# Patient Record
Sex: Male | Born: 1965 | Race: Black or African American | Hispanic: No | Smoking: Current some day smoker
Health system: Southern US, Community
[De-identification: ages and names within clinical notes are randomized; demographics above are authoritative.]

## PROBLEM LIST (undated history)

## (undated) DIAGNOSIS — M1990 Unspecified osteoarthritis, unspecified site: Secondary | ICD-10-CM

## (undated) DIAGNOSIS — M109 Gout, unspecified: Secondary | ICD-10-CM

## (undated) DIAGNOSIS — I10 Essential (primary) hypertension: Secondary | ICD-10-CM

## (undated) DIAGNOSIS — N289 Disorder of kidney and ureter, unspecified: Secondary | ICD-10-CM

## (undated) HISTORY — PX: KNEE ARTHROSCOPY: SUR90

---

## 2000-03-12 ENCOUNTER — Inpatient Hospital Stay (HOSPITAL_COMMUNITY): Admission: EM | Admit: 2000-03-12 | Discharge: 2000-03-15 | Payer: Self-pay | Admitting: Cardiology

## 2001-05-26 ENCOUNTER — Emergency Department (HOSPITAL_COMMUNITY): Admission: EM | Admit: 2001-05-26 | Discharge: 2001-05-26 | Payer: Self-pay | Admitting: Emergency Medicine

## 2001-07-31 ENCOUNTER — Inpatient Hospital Stay (HOSPITAL_COMMUNITY): Admission: EM | Admit: 2001-07-31 | Discharge: 2001-08-05 | Payer: Self-pay | Admitting: *Deleted

## 2001-07-31 ENCOUNTER — Encounter: Payer: Self-pay | Admitting: *Deleted

## 2001-08-09 ENCOUNTER — Encounter: Admission: RE | Admit: 2001-08-09 | Discharge: 2001-11-07 | Payer: Self-pay | Admitting: Internal Medicine

## 2001-08-12 ENCOUNTER — Emergency Department (HOSPITAL_COMMUNITY): Admission: EM | Admit: 2001-08-12 | Discharge: 2001-08-13 | Payer: Self-pay | Admitting: *Deleted

## 2001-08-13 ENCOUNTER — Encounter: Payer: Self-pay | Admitting: *Deleted

## 2001-09-03 ENCOUNTER — Emergency Department (HOSPITAL_COMMUNITY): Admission: EM | Admit: 2001-09-03 | Discharge: 2001-09-03 | Payer: Self-pay | Admitting: *Deleted

## 2001-09-06 ENCOUNTER — Emergency Department (HOSPITAL_COMMUNITY): Admission: EM | Admit: 2001-09-06 | Discharge: 2001-09-06 | Payer: Self-pay | Admitting: *Deleted

## 2001-10-20 ENCOUNTER — Emergency Department (HOSPITAL_COMMUNITY): Admission: EM | Admit: 2001-10-20 | Discharge: 2001-10-20 | Payer: Self-pay | Admitting: Emergency Medicine

## 2001-12-09 ENCOUNTER — Emergency Department (HOSPITAL_COMMUNITY): Admission: EM | Admit: 2001-12-09 | Discharge: 2001-12-09 | Payer: Self-pay | Admitting: Internal Medicine

## 2002-04-16 ENCOUNTER — Emergency Department (HOSPITAL_COMMUNITY): Admission: EM | Admit: 2002-04-16 | Discharge: 2002-04-16 | Payer: Self-pay | Admitting: Internal Medicine

## 2002-10-13 ENCOUNTER — Emergency Department (HOSPITAL_COMMUNITY): Admission: EM | Admit: 2002-10-13 | Discharge: 2002-10-13 | Payer: Self-pay | Admitting: Emergency Medicine

## 2002-11-19 ENCOUNTER — Emergency Department (HOSPITAL_COMMUNITY): Admission: EM | Admit: 2002-11-19 | Discharge: 2002-11-19 | Payer: Self-pay | Admitting: Internal Medicine

## 2002-11-19 ENCOUNTER — Encounter: Payer: Self-pay | Admitting: Internal Medicine

## 2002-11-22 ENCOUNTER — Inpatient Hospital Stay (HOSPITAL_COMMUNITY): Admission: EM | Admit: 2002-11-22 | Discharge: 2002-11-24 | Payer: Self-pay | Admitting: Cardiology

## 2002-11-22 ENCOUNTER — Encounter: Payer: Self-pay | Admitting: Emergency Medicine

## 2002-12-11 ENCOUNTER — Encounter: Payer: Self-pay | Admitting: Emergency Medicine

## 2002-12-11 ENCOUNTER — Emergency Department (HOSPITAL_COMMUNITY): Admission: EM | Admit: 2002-12-11 | Discharge: 2002-12-11 | Payer: Self-pay | Admitting: Emergency Medicine

## 2002-12-13 ENCOUNTER — Ambulatory Visit (HOSPITAL_COMMUNITY): Admission: RE | Admit: 2002-12-13 | Discharge: 2002-12-14 | Payer: Self-pay | Admitting: Internal Medicine

## 2004-01-10 ENCOUNTER — Inpatient Hospital Stay (HOSPITAL_COMMUNITY): Admission: EM | Admit: 2004-01-10 | Discharge: 2004-01-12 | Payer: Self-pay | Admitting: Emergency Medicine

## 2004-02-19 ENCOUNTER — Emergency Department (HOSPITAL_COMMUNITY): Admission: EM | Admit: 2004-02-19 | Discharge: 2004-02-19 | Payer: Self-pay | Admitting: Emergency Medicine

## 2004-07-29 ENCOUNTER — Observation Stay (HOSPITAL_COMMUNITY): Admission: EM | Admit: 2004-07-29 | Discharge: 2004-07-31 | Payer: Self-pay | Admitting: Emergency Medicine

## 2007-06-29 ENCOUNTER — Emergency Department (HOSPITAL_COMMUNITY): Admission: EM | Admit: 2007-06-29 | Discharge: 2007-06-29 | Payer: Self-pay | Admitting: Emergency Medicine

## 2007-12-16 ENCOUNTER — Emergency Department (HOSPITAL_COMMUNITY): Admission: EM | Admit: 2007-12-16 | Discharge: 2007-12-16 | Payer: Self-pay | Admitting: Emergency Medicine

## 2007-12-26 ENCOUNTER — Emergency Department (HOSPITAL_COMMUNITY): Admission: EM | Admit: 2007-12-26 | Discharge: 2007-12-26 | Payer: Self-pay | Admitting: Emergency Medicine

## 2008-05-18 ENCOUNTER — Emergency Department (HOSPITAL_COMMUNITY): Admission: EM | Admit: 2008-05-18 | Discharge: 2008-05-18 | Payer: Self-pay | Admitting: Emergency Medicine

## 2008-07-10 ENCOUNTER — Emergency Department (HOSPITAL_COMMUNITY): Admission: EM | Admit: 2008-07-10 | Discharge: 2008-07-10 | Payer: Self-pay | Admitting: Emergency Medicine

## 2009-05-02 ENCOUNTER — Emergency Department (HOSPITAL_COMMUNITY): Admission: EM | Admit: 2009-05-02 | Discharge: 2009-05-02 | Payer: Self-pay | Admitting: Emergency Medicine

## 2009-05-24 ENCOUNTER — Emergency Department (HOSPITAL_COMMUNITY): Admission: EM | Admit: 2009-05-24 | Discharge: 2009-05-24 | Payer: Self-pay | Admitting: Emergency Medicine

## 2011-03-17 LAB — SYNOVIAL CELL COUNT + DIFF, W/ CRYSTALS
Eosinophils-Synovial: 0 % (ref 0–1)
Lymphocytes-Synovial Fld: 3 % (ref 0–20)
Neutrophil, Synovial: 91 % — ABNORMAL HIGH (ref 0–25)

## 2011-03-17 LAB — BODY FLUID CULTURE: Culture: NO GROWTH

## 2011-03-17 LAB — AFB CULTURE WITH SMEAR (NOT AT ARMC): Acid Fast Smear: NONE SEEN

## 2011-03-17 LAB — LYME DISEASE DNA BY PCR(BORRELIA BURG): Lyme Disease(B.burgdorferi)PCR: NOT DETECTED

## 2011-03-17 LAB — GLUCOSE, SYNOVIAL FLUID: Glucose, Synovial Fluid: 73 mg/dL

## 2011-03-17 LAB — PROTEIN, BODY FLUID: Total protein, fluid: 4.6 g/dL

## 2011-04-22 ENCOUNTER — Emergency Department (HOSPITAL_COMMUNITY)
Admission: EM | Admit: 2011-04-22 | Discharge: 2011-04-22 | Discharge status: Against Medical Advice | Payer: Non-veteran care | Attending: Emergency Medicine | Admitting: Emergency Medicine

## 2011-04-22 DIAGNOSIS — Z0389 Encounter for observation for other suspected diseases and conditions ruled out: Secondary | ICD-10-CM | POA: Insufficient documentation
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
## 2011-04-24 NOTE — Group Therapy Note (Signed)
[facility]  Patient:    Kristopher Hogan, Kristopher Hogan Visit Number: [id number] MRN: [id number]          Service Type: Attending:  Kari Baars, M.D. Dictated by:   Kari Baars, M.D.                     Progress Note/EKG Interpretations  1745 on September 06, 2001  INTERPRETATION: The rhythm is a supraventricular tachycardia with a very fast rate at about 220.  There are ST-T wave abnormalities which could be related to ischemia.  IMPRESSION: Abnormal electrocardiogram. Dictated by:   Kari Baars, M.D. Attending:  Kari Baars, M.D. DD:  09/07/01 TD:  09/08/01 Job: [id number]

## 2011-04-24 NOTE — Discharge Summary (Signed)
NAME:  Kristopher Hogan, Kristopher Hogan                        ACCOUNT NO.:  [id number]   MEDICAL RECORD NO.:  [id number]                   PATIENT TYPE:  INP   LOCATION:  A226                                 FACILITY:  [facility]   PHYSICIAN:  Tesfaye D. Felecia Shelling, M.D.              DATE OF BIRTH:  Jul 16, 1966   DATE OF ADMISSION:  01/09/2004  DATE OF DISCHARGE:  01/12/2004                                 DISCHARGE SUMMARY   DISCHARGE DIAGNOSES:  1. Acute gastroenteritis.  2. Hypertension secondary to above.  3. Renal insufficiency secondary to dehydration.  4. Diabetes mellitus.  5. History of __________ tachycardia and status post ablation therapy.  6. Hypertension.   DISCHARGE MEDICATIONS:  1. Humulin insulin 70/30, 60 units subcu q.a.m. and 40 units subcu q.p.m.  2. Spironolactone 25 mg 2 tablets p.o. q.a.m.  3. Glipizide 5 mg p.o. b.i.d.  4. Cardizem 360 mg p.o. daily.  5. Lisinopril 40 mg p.o. daily.   DISPOSITION:  The patient was discharged home in a stable condition.   HOSPITAL COURSE:  This is a 45 year old male patient with a history of  nausea, vomiting and abdominal pain.  He was admitted on January 09, 2004.  The patient was found to have severe dehydration.  His BUN and creatinine  were elevated.  The patient was given IV fluid and his renal function was  closely monitored and a nephrology consult was done, who assisted in his  treatment.  Over the hospital stay the patient gradually improved.  His  symptoms subsided.  His renal function completely resolved and the patient  came back to his baseline.  He was discharged back home in stable condition.     ___________________________________________                                         [name] Felecia Shelling, M.D.   TDF/MEDQ  D:  02/05/2004  T:  02/05/2004  Job:  [id number]

## 2011-04-24 NOTE — Op Note (Signed)
NAME:  Kristopher Hogan, Kristopher Hogan                        ACCOUNT NO.:  [id number]   MEDICAL RECORD NO.:  [id number]                   PATIENT TYPE:  OIB   LOCATION:  2001                                 FACILITY:  [facility]   PHYSICIAN:  Duke Salvia, M.D. [facility]           DATE OF BIRTH:  1966-07-16   DATE OF PROCEDURE:  12/13/2002  DATE OF DISCHARGE:                                 OPERATIVE REPORT   NOTE:  Optical disk number is [id number].   PREPROCEDURE DIAGNOSIS:  Recurrent atrioventricular re-entry tachycardia  with previous electrophysiologic study at which time mapping results had  bumping and nonconduction of his pathway.   POSTPROCEDURE DIAGNOSES:  1. Concealed anteroseptal accessory pathway.  2. Transient complete heart block.   PROCEDURE PERFORMED:  1. Invasive electrophysiological study.  2. Radiofrequency catheter ablation.  3. __________ retinal infusion.   CARDIOLOGIST:  Duke Salvia, M.D.   DESCRIPTION OF PROCEDURE:  Following the obtainment of informed consent the  patient was brought to the electrophysiology laboratory and placed on the  fluoroscopic table in the supine position.  After routine prep and drape  cardiac catheterization was performed under local anesthesia and conscious  sedation.  Noninvasive blood pressure monitoring, transcutaneous oxygen  saturation monitoring and end tidal CO2 monitoring were performed  continuously throughout the procedure.   Following the procedure the catheters were removed, hemostasis was obtained  and the patient was transferred to the floor in stable condition.   Catheters:  A 5 French quadripolar catheter.  It was inserted via the left  femoral vein to the AV junction.  A 5 French quadripolar catheter  was  inserted via the left femoral vein to the right ventricular apex.  A 7  French duodecapolar was inserted via the left femoral vein to the tricuspid  annulus and then to the coronary sinus.  A 7 French 4 mm deflectable-tip  catheter was inserted via Riley Kill (SRO) sheath to mapping sites in the  midseptal and anteroseptal space.   Surface leads 1, aVF and V1 were monitored continuous throughout the  procedure.   Following insertion of the catheters stimulation protocol included  incremental atrial pacing, incremental ventricular pacing, single atrial  extrastimuli in the absence and the presence of verapamil and/or  isoproterenol at 600, 500 and 400 msec.  Single and double ventricular  extrastimuli in the same context as above.   RESULTS:  Surface Electrocardiogram:  Rhythm:  Initial and final are sinus.  Cycle length is 811 initial msec and 692 msec final.  P-R interval is 194 initial msec and 192 msec final.  QRS duration is 95 msec initial and 96 msec final.  Q-T interval is 397 msec initial and 397 msec final.  P-wave duration is 124 msec initial and 131 msec final.  Bundle branch block is absent and absent.  Pre-excitation is absent and absent.   A-V Nodal Function:  A-H interval is 97 msec initial and  final is 118 msec.  A-V Wenckebach was 280 msec preablation and 280 msec postablation.  V-A Wenckebach was approximately 320 msec preablation and 400 msec  postablation.  A-V conduction was continuous pre- and postablation.   His Purkinje System Function:  H-V interval is 43 msec initial and 52 msec final.   Accessory Pathway Function:  An accessory pathway was identified in the  right mid to anteroseptal space.  At his previous ablation the catheter  identified the earliest his atrial activation was anterior to and somewhat  medial to in the LAO the His bundle.  Today the earliest atrial activation  was mapped just inferior to the His bundle.  The His bundle electrogram  actually being identified over a total space of approximately 10 mm.  There  was a minimal His electrogram on the ablation catheter at the site of  ablation (see below).   Arrhythmia Induced:  SVT was finally induced after  about two hours of  attempts at induction.  Ultimately it was reproducible inducible at 600:320  and I suspect that during catheter placement I had bumped the pathway again.  The cycle length of the tachycardia was 210 msec and earliest atrial  activation was as noted on the anteroseptal space.   Two applications of RF energy were applied; one of 61 seconds duration and  one of 17 seconds duration (see below), the first of which terminated  tachycardia, but it terminated tachycardia after V-A interval, which made me  suspect that conduction through the pathway had not been eliminated; and,  indeed tachycardia was subsequently reinducible.  The last application of RF  energy resulted in termination of tachycardia initially with A-H  prolongation and then no antegrade conduction, that is A-A not conducting to  V.  There was antegrade conduction on the following beat.  There was then no  antegrade conduction across the A-V node on two subsequent beats and RF  energy was discontinued.  A total of five seconds of RF energy was applied  at maximum temperature output as the temperature had been incrementally  increased from 40 degrees to 60 degrees with neutral problems with A-V nodal  conduction.  However, following the application of RF energy two things were  noted:  One was that the A-H interval was somewhat longer and the A-V  Wenckebach cycle length was considerably longer.  In addition V-A conduction  over the pathway was not seen as there was Wenckebach distally in the  retrograde direction at cycle lengths of 400-460 msec.   Total RF energy was 68 seconds.  Total fluoroscopy time was 15 minutes.   IMPRESSION:  1. Normal sinus function.  2. Normal atrial function.  3. Normal A-V nodal function (see below).  4. Normal His Purkinje system function.  5. An accessory pathway was identified in the anteroseptal space.  I suspect    that this was a recurrence of the previous tachycardia,  although it     seemed to be a little bit more inferior than previously described.   Transient complete heart block resulted obviously in elimination of the  tachycardia, but furthermore there was no further inducibility of  tachycardia or evidence of retrograde conduction at a cycle length  consistent with V-A conduction over a pathway.  Partly this may have  resulted from just transient damage given the short duration of RF, but  after a wait of 45 minutes still no significant V-A conduction was  identified.  At this point the  procedure was terminated with only modest  optimism that conduction over the accessory pathway was permanently  eliminated.                                                  Duke Salvia, M.D. [facility]    SCK/MEDQ  D:  12/13/2002  T:  12/13/2002  Job:  [id number]

## 2011-04-24 NOTE — Consult Note (Signed)
NAME:  Kristopher Hogan, Kristopher Hogan                        ACCOUNT NO.:  [id number]   MEDICAL RECORD NO.:  [id number]                   PATIENT TYPE:  INP   LOCATION:  A226                                 FACILITY:  [facility]   PHYSICIAN:  Jorja Loa, M.D.             DATE OF BIRTH:  [birth year]   DATE OF CONSULTATION:  DATE OF DISCHARGE:                                   CONSULTATION   REASON FOR CONSULTATION:  Renal insufficiency.   HISTORY:  Elwin is a 45 year old African American who has a past medical  history of type 2 diabetes, uncontrolled hypertension, history of SVT who  presently came to the emergency room with a three-day history of nausea,  vomiting, severe, watery diarrhea with hypotension.  Hence, the patient was  admitted for gastroenteritis.  As a work up on his admission, the patient  was found to have an elevated BUN and creatinine.  Hence, consultation is  called.  The patient at this moment denies any previous history of renal  insufficiency or history of kidney stone.  No edema, no diabetic  retinopathy, and also no diabetic neuropathy.  Overall, he claims to be  feeling good until this time.   This morning, he denies any fever, chills, or sweating.  The nausea and  vomiting seems to be getting better.   PAST MEDICAL HISTORY:  As listed above.  A patient has a longstanding  history of hypertension, uncontrolled, and type 2 diabetes at least four or  five years.  A history of SVT, status post a catheter ablation x2, and  history of right knee surgery.   SOCIAL HISTORY:  He has according to him, a previous history of tobacco  abuse, but no smoking at this moment.  History of also alcohol abuse.  He  denies any drug use.   ALLERGIES:  There is a question of a history of allergy to penicillin.   MEDICATIONS:  1. Novolin and NovoLog 10 subcu q.h.s.  _________ mg IV at 150 cc an hour.  2. Tylenol on a p.r.n. basis.  3. Lomotil one tablet on p.r.n. basis.  4. _________  p.r.n. basis.   The patient states that he has been on blood pressure medication including  lisinopril, Diltiazem and  spironolactone.   REVIEW OF SYSTEMS:  He feels better this morning.  No nausea or vomiting.  No chest pain.  No shortness of breath is noted.  Appetite is reasonable.  No diarrhea.   PHYSICAL EXAMINATION:  VITAL SIGNS:  Temperature 97.9, pulse 85, blood  pressure 88/49 which seems to be improving from 73/41.  HEENT:  No conjunctival pallor, nonicteric.  Oral mucosa seems to be moist.  NECK:  Supple, no JVD.  CHEST:  Clear to auscultation, no rales, no rhonchi.  HEART:  Exam revealed regular rate and rhythm, no murmur, no S2 or S3.  ABDOMEN:  Soft, positive bowel sounds.  EXTREMITIES:  No edema.  LABORATORY DATA:  He has 1300 input and 160 so far output.  His blood work,  white cell count is 9.9, hemoglobin 14, hematocrit 40.8, platelets 301.  His  sodium is 132.  Yesterday it was 130.  Potassium 4.9.  CO2 18, BUN 59,  creatinine 6.3.  When he came, his BUN was 64 and creatinine 8.5 and calcium  is 8.6.  His urine specific gravity is 1.03.  He has small bilirubin, large  ketone, protein, and he has some bacteria and nitrites and leukocytes is  negative.   ASSESSMENT:  1. Acute renal failure.  At this moment, seems to be from possible     dehydration of his urine.  Specific  gravity is very high.  BUN and     creatinine ratio also seems to be high with hypotension.  Since the     patient does not have any history of diabetic retinopathy, no neuropathy,     no edema, at this moment, not sure whether the patient has underlying     hypertensive or diabetic nephropathy.  2. Small proteinuria.  This is a concentrated urine.  Hence, probably may     not be significant.  3. Hypotension from intravascular volume associated with dehydration and     also because of diarrhea.  4. Diabetes.  He is on insulin.  5. History of hypotension .  Blood pressure low.  He is not on  any     medication at this moment.  6. History of supraventricular tachycardia, status post ablation.   RECOMMENDATION:  1. I agree with hydration.  2. We will do ultrasound of the kidneys to evaluate his kidneys.  At this     moment, we will check also his UA in the morning.  We will do an A     complement, hepatitis A and hepatitis B to rule out other causes of renal     insufficiency and some proteinuria.  We will follow the patient.      ___________________________________________                                            Jorja Loa, M.D.   BB/MEDQ  D:  01/10/2004  T:  01/10/2004  Job:  [id number]

## 2011-04-24 NOTE — H&P (Signed)
NAME:  Kristopher Hogan, Kristopher Hogan                        ACCOUNT NO.:  [id number]   MEDICAL RECORD NO.:  [id number]                   PATIENT TYPE:  OBV   LOCATION:  A226                                 FACILITY:  [facility]   PHYSICIAN:  Kingsley Callander. Ouida Sills, M.D.                  DATE OF BIRTH:  15-Oct-1966   DATE OF ADMISSION:  01/09/2004  DATE OF DISCHARGE:                                HISTORY & PHYSICAL   CHIEF COMPLAINT:  Vomiting and diarrhea.   HISTORY OF PRESENT ILLNESS:  This patient is a 45 year old African American  male with a history of diabetes and kidney failure who presented to the  emergency room with a three day history of a gastrointestinal illness.  He  last vomited on Monday.  He has had nausea persisting.  He was able to eat a  hamburger earlier today.  He has had persistent diarrhea, though, for three  days.  He has had approximately eight watery stools today.  He denies any  rectal bleeding, melena, or hematemesis.  He has not experienced abdominal  pain other than some mild cramping earlier.  He was evaluated and treated in  the emergency room for hypotension.  His initial blood pressure was 82/43.  Despite IV fluids, his blood pressure standing was 83/49 with a pulse of  101.  His BUN and creatinine were also quite elevated at 64 and 8.5.  His  baseline is not known.  He does have a history of renal insufficiency and  takes Lisinopril, spironolactone, and Diltiazem.  He has continued to take  these medications throughout his GI illness.   PAST MEDICAL HISTORY:  1. Hypertension.  2. Diabetes.  3. Kidney failure.  4. Right knee surgery.  5. Two ablations by Dr. Graciela Husbands.   MEDICATIONS:  1. Humulin 70/30, 60 units q.a.m., 40 units q.p.m.  2. Spironolactone 25 mg two q.a.m.  3. Glipizide 5 mg two b.i.d.  4. Diltiazem 360 mg every day.  5. Lisinopril 40 mg every day.   ALLERGIES:  PENICILLIN.   SOCIAL HISTORY:  He does not smoke cigarettes.  He drinks alcohol  occasionally.   He is disabled from the service and routinely has his medical  care through the [facility].   FAMILY HISTORY:  His mother had hypertension and diabetes.   REVIEW OF SYSTEMS:  He denies chest pain, abdominal pain, or syncope.  He  states he is still making urine quite well.   PHYSICAL EXAMINATION:  VITAL SIGNS:  Temperature 97.5, blood pressure 79/47  lying, pulse 92, respirations 20.  GENERAL:  Alert and oriented in no distress.  HEENT:  The oropharynx appears moist.  Eyes were unremarkable.  NECK:  Supple without JVD or thyromegaly.  LUNGS:  Clear.  HEART:  Regular with no murmurs.  ABDOMEN:  Nontender with no hepatosplenomegaly.  EXTREMITIES:  No cyanosis, clubbing or edema.  NEUROLOGIC:  Grossly intact.  LABORATORY DATA:  White count of 9.9, hemoglobin 14, platelets 301.  Sodium  130, potassium 4.6, bicarb 22, glucose 119, BUN 64, creatinine 8.5.   IMPRESSION:  1. Hypotension, renal failure, dehydration.  He is being hospitalized for     additional intravenous fluids.  We will reassess his BUN and creatinine     tomorrow morning.  His Lisinopril, spironolactone and Diltiazem will be     held.  His diarrhea will be worked up with stool studies.  2. Diabetes.  We will hold his Glipizide and insulin and follow morning and     evening Accu-Chek and treated with sliding scale Humalog.  3. Status post ablations.     ___________________________________________                                         Kingsley Callander. Ouida Sills, M.D.   ROF/MEDQ  D:  01/09/2004  T:  01/09/2004  Job:  [id number]   cc:   Tesfaye D. Felecia Shelling, M.D.  [address]  [place]  [place] [address]  Fax: [phone]

## 2011-04-24 NOTE — H&P (Signed)
NAME:  Kristopher Hogan, Kristopher Hogan                        ACCOUNT NO.:  [id number]   MEDICAL RECORD NO.:  [id number]                   PATIENT TYPE:  INP   LOCATION:  2029                                 FACILITY:  [facility]   PHYSICIAN:  Doylene Canning. Ladona Ridgel, M.D. [facility]           DATE OF BIRTH:  1966-09-13   DATE OF ADMISSION:  11/22/2002  DATE OF DISCHARGE:                                HISTORY & PHYSICAL   ADMISSION DIAGNOSIS:  Recurrent incessant supraventricular tachycardia  associated with hemodynamic instability.   HISTORY OF PRESENT ILLNESS:  The patient is a very pleasant 45 year old man  with a history of recurrent tachy palpitations for many years.  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  At  that time, he underwent successful catheter ablation but developed recurrent  tachy palpitations beginning about two years after his procedure. Since  then, he has had recurrent episodes of SVT.  He was in his usual state of  health until this morning when he awoke with SVT and was taken to the  emergency room at [facility]. At that time he was given successive  doses of adenosine without termination of his tachycardia.  He was  transferred here for additional evaluation.   At the time of his transfer, his heart rate was 240 beats per minute with a  blood pressure of 100.  The patient felt anxious, clammy, and dyspneic with  his SVT.  He was subsequently treated at the bedside with intravenous  verapamil delivered under my direct guidance with a total of 7 mg delivered  followed by carotid sinus massage with resultant termination of his SVT.   PAST MEDICAL HISTORY:  As previously noted.  In addition, he has a history  of hypertension and tobacco abuse.  There is a history of diabetes.   SOCIAL HISTORY:  The patient is single, disabled.  Denies tobacco or ethanol  use.   FAMILY HISTORY:  Notable for both parents being alive and otherwise in good  health.  Review of Systems notable for no fevers, chills, night sweats.  He  does have palpitations.  He denies polyuria or polydipsia.  The rest of his  Review of Systems is negative and as noted by Chinita Pester in her admission  note.   PHYSICAL EXAMINATION:  GENERAL:  He is a pleasant, well-appearing man in no  distress.  Prior to termination of SVT, he was diaphoretic, pale, clammy,  and very anxious.  VITAL SIGNS:  Blood pressure presently is 130/94, pulse 72 and regular,  respirations 18.  HEENT:  Normocephalic and atraumatic.  Pupils are equal and round.  Oropharynx was moist.  The sclerae were anicteric.  NECK:  No jugular venous distention.  Carotids 2+ and symmetric.  Trachea  was midline.  There was no thyromegaly.  CARDIOVASCULAR:  Regular  rate and rhythm with normal S1 and S2. There were  no gallops.  LUNGS: Clear bilaterally to auscultation.  ABDOMEN:  Soft, nontender, nondistended.  There was no organomegaly.  EXTREMITIES:  NO cyanosis, clubbing, or edema.  NEUROLOGIC:  Alert and oriented x 3 with cranial nerves II-XII grossly  intact.  Strength 5/5 and symmetric.   LABORATORY DATA:  EKG demonstrates normal sinus rhythm with normal axis and  intervals.  EKG in SVT demonstrates narrow QRS tachycardia with QRS  alternans and a rate of 220 to 240 beats per minute.   IMPRESSION:  1. 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  2. Hypertension.  3. Diabetes.   DISCUSSION:  I have discussed the treatment options with the patient.  He is  willing to proceed with UPS and catheter ablation of his SVT.  This will be  attempted to be scheduled for tomorrow morning, schedule permitting.  If  not, he will be discharged home and allowed to return as an outpatient for  ablation therapy.                                                Doylene Canning. Ladona Ridgel, M.D. [facility]    GWT/MEDQ  D:  11/22/2002  T:  11/22/2002  Job:  [id number]   cc:   Tesfaye D. Felecia Shelling, M.D.  [address]  [place]  [place] [address]  Fax: [phone]   [facility], Deberah Pelton, M.D. [facility]

## 2011-04-24 NOTE — Discharge Summary (Signed)
NAME:  Kristopher Hogan, Kristopher Hogan                        ACCOUNT NO.:  [id number]   MEDICAL RECORD NO.:  [id number]                   PATIENT TYPE:  INP   LOCATION:  A302                                 FACILITY:  [facility]   PHYSICIAN:  Tesfaye D. Felecia Hogan, M.D.              DATE OF BIRTH:  Apr 26, 1966   DATE OF ADMISSION:  07/29/2004  DATE OF DISCHARGE:  07/31/2004                                 DISCHARGE SUMMARY   DISCHARGE DIAGNOSES:  1. Laceration of the right forearm.  2. Anemia secondary to blood loss.  3. Diabetes mellitus.  4. __________  5. History of tachyarrhythmia, status post ablation therapy.   DISCHARGE MEDICATIONS:  1. Keflex 500 mg one tablet p.o. q.i.d. for seven days.  2. Lortab 5/500 mg one tablet p.o. q.6h. for pain.  3. Humulin insulin 70/30 60 units subcutaneously in the a.m. and 40 units     subcutaneously in p.m.  4. Cardizem CD 360 mg p.o. daily.  5. Lisinopril 40 mg p.o. daily.  6. Glucotrol 5 mg p.o. b.i.d.  7. Spironolactone 25 mg two tablets p.o. daily.   DISPOSITION:  The patient will be discharged home in stable condition.   HISTORY OF PRESENT ILLNESS:  This is a 45 year old male patient with a  history of __________ diabetes mellitus.  He sustained a laceration on his  right forearm.  The patient was brought to the emergency room where he was  evaluated and his wounds were sutured.  He had a large laceration which had  some arterial bleed.  The patient lost a significant amount of blood and he  became orthostatic and dizzy.  The patient was started on IV fluids and was  admitted.   HOSPITAL COURSE:  The patient was monitored and his hemoglobin and  hematocrit were repeated.  There was a significant drop in his hemoglobin  and hematocrit.  The patient was transfused two units of packed red blood  cells.  His hemoglobin and hematocrit improved.  The patient has no further  bleeding.  He was evaluated by Dr. Lovell Sheehan and advised to continue on  outpatient  physical therapy.  The patient will be discharged back home on  oral antibiotics and pain medication.     ___________________________________________                                         Kristopher Hogan, M.D.   TDF/MEDQ  D:  07/31/2004  T:  07/31/2004  Job:  [id number]

## 2011-04-24 NOTE — Procedures (Signed)
   NAME:  Kristopher Hogan, Kristopher Hogan                        ACCOUNT NO.:  [id number]   MEDICAL RECORD NO.:  [id number]                   PATIENT TYPE:  OIB   LOCATION:  NA                                   FACILITY:  [facility]   PHYSICIAN:  Edward L. Juanetta Gosling, M.D.             DATE OF BIRTH:  1966-08-28   DATE OF PROCEDURE:  11/19/2002  DATE OF DISCHARGE:                                EKG INTERPRETATION   The rhythm appears to be atrial fibrillation with a rapid ventricular  response at about 200.  There are fairly marked ST-T wave changes diffusely  which could indicate ischemia.  Abnormal electrocardiogram.                                               Oneal Deputy. Juanetta Gosling, M.D.    ELH/MEDQ  D:  12/12/2002  T:  12/13/2002  Job:  [id number]

## 2011-04-24 NOTE — Discharge Summary (Signed)
[address] [facility]  Patient:    Kristopher Hogan, Kristopher Hogan                     MRN: [id number] Adm. Date:  [id number] Disc. Date: 03/15/00 Attending:  Nelta Numbers Dictator:   Abelino Derrick, P.A.-C. [facility] CC:         Gerrit Friends. Dietrich Pates, M.D. [facility], [place]                           Discharge Summary  DISCHARGE DIAGNOSES: 1. Recurrent premature supraventricular ventricular tachycardia, evaluated    by Dr. Doylene Canning. Ladona Ridgel this admission. 2. History of ETOH abuse. 3. Hypertension. 4. PENICILLIN allergy.  HISTORY OF PRESENT ILLNESS:  The patient is a 45 year old male with a history of PSVT.  He had radiofrequency ablation two years ago by Dr. Nathen May. He was last seen a couple of months ago in the office.  He was admitted on March 11, 2000, to [place] with a rapid heart rate.  He was treated with adenosine, Lanoxin, Cardizem, and eventually cardioverted to sinus rhythm.  He has a history of daily alcohol use.  His ETOH level use was 225 on admission.  He apparently as not been taking his medicines regularly.  He maintained sinus rhythm.  HOSPITAL COURSE:  He was transferred from [facility] to [facility] for an EP evaluation.  It was noted at [facility] that his TSH was 0.42 with a T3 of 31, and this was repeated.  His TSH here was 0.616, with a normal free T4.  The patient was admitted to telemetry.  He was started n antibiotics for bronchitis.  He was started on Cardizem.  DISPOSITION:  Dr. Ladona Ridgel saw him on March 15, 2000, and feels that he can be discharged on Cardizem.  We have also added flecainide at discharge.  He received 50 mg prior to going home, and then is to be put on 100 mg b.i.d.  FOLLOWUP:  He will come to the office in 48 hours for an electrocardiogram.  He  will follow up with the P.A[facility] in [place] later this week.  Dr. Bruna Potter EP note revealed that the patient had a  concealed anteroseptal accessory pathway, and __________ SVT.  He felt that a redo catheter ablation carried a risk for this patient for a heart block.  He recommended medical therapy, as noted above.   He also says he would not perform an exercise treadmill on this patient, because of his inactivity and severe limitations because of knee surgery.  DISCHARGE MEDICATIONS: 1. Cardizem CD 240 mg q.d. 2. Flecainide 100 mg b.i.d. 3. Aspirin q.d. 4. Zithromax 250 mg q.d. for three days.  LABORATORY DATA:  At [facility] the CPK and troponin were negative. BUN was 10, creatinine 1.0, sodium 140, potassium 3.7.  SGPT 200.  TSH 0.42 as noted. Drug screen was negative.  Alcohol level was 225. White count 10, hemoglobin 16.9, hematocrit 50, platelet count 269.  INR 1.02.  The laboratory revealed a free T4 of 1.11 and a repeat TSH of 0.616.  Chest x-ray revealed mild cardiac prominence.  CONDITION ON DISCHARGE:  The patient is discharged in sinus rhythm.  His QTC is 445 at discharge. DD:  03/15/00 TD:  03/15/00 Job: [id number]

## 2011-04-24 NOTE — Discharge Summary (Signed)
   NAME:  Kristopher Hogan, Kristopher Hogan                        ACCOUNT NO.:  [id number]   MEDICAL RECORD NO.:  [id number]                   PATIENT TYPE:  OIB   LOCATION:  2001                                 FACILITY:  [facility]   PHYSICIAN:  Duke Salvia, M.D. [facility]           DATE OF BIRTH:  03-09-1966   DATE OF ADMISSION:  12/13/2002  DATE OF DISCHARGE:  12/14/2002                                 DISCHARGE SUMMARY   PRIMARY DIAGNOSIS:  Supraventricular tachycardia.   SECONDARY DIAGNOSES:  1. Hypertension.  2. Tobacco abuse.  3. Diabetes.   HISTORY OF PRESENT ILLNESS:  This is a 45 year old gentleman with past  medical history of SVT who was recently admitted to [facility] on November 22, 2002, for SVT.  He had an EP study on November 23, 2002, showing retrograde conduction, mid septal posterior septal area which  was bumped.  The patient was then unable to be induced.  He was discharged  to home, to be readmitted.  The patient was readmitted on December 13, 2002,  for ablation of this tachy palpitations.   PAST MEDICAL HISTORY:  As stated above.   HOSPITAL COURSE:  The patient was admitted and underwent EP procedure.  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  He had prolonged noninducibility, development of complete heart  block for less than 10 seconds with radiofrequency ablation after which VA  conduction remained exceedingly poor and was not inducible.  It was thought  that the patient may wind up with reoccurrence.  He was discharged the  following day in stable condition.   DISCHARGE MEDICATIONS:  1. Insulin 70/30 50 units in the morning and 40 units at night.  2. Lisinopril 40 daily.  3. Cardizem 240 daily.  4. Lopressor 25 b.i.d.  5. No verapamil.   ACTIVITY:  He was not to do any heavy lifting or strenuous activity for four  days, no driving for two days.   DIET:  Low fat, low cholesterol, low salt  diet.   WOUND CARE:  He was to call if he developed any drainage or lump in his  groin.    FOLLOW UP:  He was scheduled to see Dr. Graciela Husbands within six weeks and the  office will call to schedule that appointment.     Chinita Pester, C.R.N.P. [facility]                 Duke Salvia, M.D. [facility]    DS/MEDQ  D:  12/14/2002  T:  12/15/2002  Job:  [id number]   cc:   [facility]  [place], [place]

## 2011-04-24 NOTE — Op Note (Signed)
NAME:  Kristopher Hogan, Kristopher Hogan                        ACCOUNT NO.:  [id number]   MEDICAL RECORD NO.:  [id number]                   PATIENT TYPE:  INP   LOCATION:  2029                                 FACILITY:  [facility]   PHYSICIAN:  Duke Salvia, M.D. [facility]           DATE OF BIRTH:  1966/07/16   DATE OF PROCEDURE:  11/23/2002  DATE OF DISCHARGE:  11/24/2002                                 OPERATIVE REPORT   PREOPERATIVE DIAGNOSIS:  Supraventricular tachycardia.   POSTOPERATIVE DIAGNOSIS:  Concealed septal accessory pathway.   PROCEDURE:  Invasive electrophysiological study, arrhythmia mapping with a  Proternol infusion.   DESCRIPTION OF PROCEDURE:  When we obtained informed consent the patient was  brought  to the electrophysiology laboratory and placed on the fluoroscopic  table in the supine position. After routine prep and drape, cardiac  catheterization was performed under local anesthesia and conscious sedation  with noninvasive blood pressure monitoring and transcutaneous  oxygen  saturation monitoring and end tidal CO2 monitoring were performed  continuously throughout the procedure. Following the procedure the catheter  was removed. Hemostasis was obtained and  the patient was sent to the floor  in stable condition.   CATHETERS:  A 5 French quadripolar catheter was inserted at the right  femoral vein in her right atrium. Next another 5 [place] quadripolar catheter  was inserted at the right femoral vein at the RV apex. A 7 French octapolar  catheter was inserted via the left femoral vein to the AV junction. A 7  French 4 mm deflectable tip catheter was inserted into the right femoral  vein to mapping sensing in the septal space.   The source leads 1, AVF and V1 were monitored continuously throughout the  procedure. Following insertion of the catheters a stimulation protocol  included incremental atrial pacing and incremental ventricular pacing.  Single atrial electrical stimuli  with a paced cycle length of 400 msec and  ventricular stimuli also at a paced cycle length of  400 msec.   RESULTS:  1. Surface electrocardiogram: First cine rhythm is sinus with a paced cycle     length of 649 msec.  2. QR interval 190 msec.  3. QHS was 100 msec.  4. QT interval 385 msec.  5. Q-wave duration 131 msec.  6. Bundle branch block is absent.  7. Preexcitation is absent.   AV NODAL FUNCTION:  1. AH interval was 104 msec.  2. AV Wenckebach is 300 msec.  3. VA Wenckebach was 250 msec.  4. AV conduction was continuous.   HIS-PURKINJE FUNCTION:  1. AH interval was 64 msec.  2. HV interval and HIS bundle duration was 13 msec.   ACCESSORY PATHWAY FUNCTION:  An accessory pathway was identified in the  right septal space. Initially, the anteroseptal area was mapped, as that was  the location of the previous pathway. However, the catheter was then moved  more posteriorly shortly thereafter. The  pathway was bumped and conduction  was discontinued. The last site, however, suggested that there was a HIS  electrogram on the bumped site, so I am not quite sure how far down the  septum this pathway was.   Arrhythmia was induced. AVRT was induced with a extra cycle length of 275  msec. It was induced with atrial extra stimuli as well as ventricular pacing  terminated by atrial burst pacing.   Characteristics of the tachycardia earliest atrial activation was on the  septum with a very complex local atrial electrogram with a VA time of about  85 msec.   The preexcitation base was 0.75. This suggested a posterior occipital  pathway.   IMPRESSION:  Concealed septal accessory pathway that somewhere  between the  anterior septum and the posterior septum, but I could not get it clarified  before the bump from the retrograde conduction.   DISPOSITION:  The patient was then washed for an hour, isoproterenol was  infused to try and facilitate retrograde pathway conduction to no avail  and  the catheters were then pulled and the procedure terminated. The patient  tolerated the procedure well.                                               Duke Salvia, M.D. [facility]    SCK/MEDQ  D:  11/23/2002  T:  11/24/2002  Job:  [id number]   cc:   Delma Freeze [facility]

## 2011-04-24 NOTE — Procedures (Signed)
   NAME:  Kristopher Hogan, Kristopher Hogan                        ACCOUNT NO.:  [id number]   MEDICAL RECORD NO.:  [id number]                   PATIENT TYPE:  EMS   LOCATION:  ED                                   FACILITY:  [facility]   PHYSICIAN:  Edward L. Juanetta Gosling, M.D.             DATE OF BIRTH:  1966/08/17   DATE OF PROCEDURE:  11/19/2002  DATE OF DISCHARGE:  11/19/2002                                EKG INTERPRETATION   PROCEDURE:  EKG.   INTERPRETATION:  The rhythm is what appears to be atrial fibrillation with a  rapid ventricular response.  The ventricular response is around 200.  There  are ST-T wave changes diffusely which are nonspecific.  Abnormal  electrocardiogram.                                               Oneal Deputy. Juanetta Gosling, M.D.    ELH/MEDQ  D:  11/19/2002  T:  11/20/2002  Job:  [id number]

## 2011-04-24 NOTE — Procedures (Signed)
   NAME:  Kristopher Hogan, Kristopher Hogan                        ACCOUNT NO.:  [id number]   MEDICAL RECORD NO.:  [id number]                   PATIENT TYPE:  EMS   LOCATION:  ED                                   FACILITY:  [facility]   PHYSICIAN:  Edward L. Juanetta Gosling, M.D.             DATE OF BIRTH:  19-Jul-1966   DATE OF PROCEDURE:  11/19/2002  DATE OF DISCHARGE:  11/19/2002                                EKG INTERPRETATION   PROCEDURE:  EKG.   INTERPRETATION:  The rhythm is a sinus rhythm with a rate of about 100.  QT  interval is slightly prolonged, suggestive of primary myocardial disease,  drug effect, or electrolyte imbalance.  There may be left atrial enlargement  as well.  Abnormal electrocardiogram.                                               Oneal Deputy. Juanetta Gosling, M.D.    ELH/MEDQ  D:  11/19/2002  T:  11/20/2002  Job:  [id number]

## 2011-04-24 NOTE — Procedures (Signed)
   NAME:  TABER, SWEETSER                        ACCOUNT NO.:  [id number]   MEDICAL RECORD NO.:  [id number]                   PATIENT TYPE:  EMS   LOCATION:  ED                                   FACILITY:  [facility]   PHYSICIAN:  Edward L. Juanetta Gosling, M.D.             DATE OF BIRTH:  1966-09-24   DATE OF PROCEDURE:  11/22/2002  DATE OF DISCHARGE:  11/22/2002                                EKG INTERPRETATION   DATE AND TIME OF TEST:  November 22, 2002 at 0743.   IMPRESSION:  The rhythm is a supraventricular tachycardia but that may be a  sinus tachycardia, but I cannot tell if it is atrial fibrillation or some  sort of sinus tachycardia.  I do not see definite P waves.  There are  diffuse nonspecific ST-T wave changes.  There are Q waves anteriorly which  could indicate a previous anterior myocardial infarction, and clinical  correlation is suggested.  Abnormal electrocardiogram.                                               Oneal Deputy. Juanetta Gosling, M.D.    ELH/MEDQ  D:  11/23/2002  T:  11/25/2002  Job:  [id number]

## 2011-04-24 NOTE — Discharge Summary (Signed)
NAME:  Kristopher Hogan, Kristopher Hogan                        ACCOUNT NO.:  [id number]   MEDICAL RECORD NO.:  [id number]                   PATIENT TYPE:  INP   LOCATION:  2029                                 FACILITY:  [facility]   PHYSICIAN:  Duke Salvia, M.D. [facility]           DATE OF BIRTH:  August 27, 1966   DATE OF ADMISSION:  11/22/2002  DATE OF DISCHARGE:  11/24/2002                                 DISCHARGE SUMMARY   PRIMARY DIAGNOSIS:  Supraventricular tachycardia.   HISTORY OF PRESENT ILLNESS:  This is a 45 year old gentleman with a history  of recurrent tachy-palpitations for many years.  He underwent an EP study  and a catheterization ablation in 1999.  At that time he was found to have  an anterior accessory pathway which was concealed.  He underwent a  successful catheter ablation, but developed recurrent tachy-palpitations  beginning about two years after his procedure.  Since then he has recurrent  episodes of SVT.  He was in his usual state of health on the morning of  admission when he awoke with SVT and was taken to the emergency room at  [facility].  At that time he was given successive doses of  adenosine, without termination of his tachycardia, and was transferred to  [facility] for evaluation.  At the time he was  transferred, his heart rate was 240 beats per minute with a blood pressure  of 100 systolic.  He felt anxious, clammy and dyspneic with his SVT.  Subsequently he was treated at bedside with IV Verapamil for a total of 7.5  mg, and carotid sinus massage which resulted in termination of SVT.   PAST MEDICAL HISTORY:  1. As noted above.  2. Hypertension.  3. Tobacco abuse.  4. Diabetes mellitus.   HOSPITAL COURSE:  As stated above, the patient went to the EP laboratory on  November 23, 2002.  He had inducible AVNRT with retrograde conduction in the  mid-septal, posterior septal pathway, pathway response while mapping.  After  waiting  approximately one hour, the rhythm could not be reproduced for  confirmation.  The study was terminated.  The patient was started on  Verapamil and discharged the following day to home, in stable condition.   DISCHARGE MEDICATIONS:  1. Zestril 40 mg q.d.  2. Insulin 70/30, 60 units q.a.m. and 40 units q. Night.  3. Lopressor 25 mg b.i.d.  4. Verapamil 240 mg q.d.   FOLLOW-UP INSTRUCTIONS:  He was scheduled for an SVT ablation on December 13, 2001, at 12:30 p.m.  He was to report to the day hospital at 10:30 a.m.  He  is to take half of his insulin that morning 30 units of 70/30.  He is not to  take his Lopressor or Verapamil on December 13, 2001.  He is not to eat or  drink anything after midnight, but he  may have  clear liquids before 8 a.m.  He is to take Tylenol one or two tab  q.4-6h. p.r.n.  No heavy lifting or strenuous activity for four days.  No driving for two  days.  A low-fat, low-salt, low-cholesterol diet.  He is to call if he  develops a lump or any drainage from his groin.      Chinita Pester, C.R.N.P. [facility]                 Duke Salvia, M.D. [facility]    DS/MEDQ  D:  11/24/2002  T:  11/25/2002  Job:  [id number]   cc:   Duke Salvia, M.D. [facility]   Damaris Hippo, M.D.  [address]  [place], [place] [address]   Terald Sleeper Office

## 2011-09-04 LAB — URINALYSIS, ROUTINE W REFLEX MICROSCOPIC
Glucose, UA: NEGATIVE
Specific Gravity, Urine: 1.02
pH: 5.5

## 2011-09-04 LAB — URINE MICROSCOPIC-ADD ON

## 2011-09-21 LAB — CBC
MCHC: 33.8
RBC: 5.01
WBC: 15.8 — ABNORMAL HIGH

## 2011-09-21 LAB — SYNOVIAL CELL COUNT + DIFF, W/ CRYSTALS: Neutrophil, Synovial: 90 — ABNORMAL HIGH

## 2011-09-21 LAB — BASIC METABOLIC PANEL
CO2: 26
Calcium: 9.1
Creatinine, Ser: 1.15
GFR calc Af Amer: >60
GFR calc non Af Amer: >60
Sodium: 134 — ABNORMAL LOW

## 2011-09-21 LAB — BODY FLUID CULTURE: Culture: NO GROWTH

## 2011-09-21 LAB — DIFFERENTIAL
Basophils Relative: 0
Eosinophils Absolute: 0
Lymphocytes Relative: 12
Monocytes Relative: 7
Neutrophils Relative %: 81 — ABNORMAL HIGH

## 2012-04-17 ENCOUNTER — Emergency Department (HOSPITAL_COMMUNITY): Payer: Non-veteran care

## 2012-04-17 ENCOUNTER — Emergency Department (HOSPITAL_COMMUNITY)
Admission: EM | Admit: 2012-04-17 | Discharge: 2012-04-17 | Disposition: A | Discharge status: Home / Self Care | Payer: Non-veteran care | Attending: Emergency Medicine | Admitting: Emergency Medicine

## 2012-04-17 ENCOUNTER — Encounter (HOSPITAL_COMMUNITY): Payer: Self-pay | Admitting: *Deleted

## 2012-04-17 DIAGNOSIS — I10 Essential (primary) hypertension: Secondary | ICD-10-CM | POA: Insufficient documentation

## 2012-04-17 DIAGNOSIS — F17200 Nicotine dependence, unspecified, uncomplicated: Secondary | ICD-10-CM | POA: Insufficient documentation

## 2012-04-17 DIAGNOSIS — M25569 Pain in unspecified knee: Secondary | ICD-10-CM | POA: Insufficient documentation

## 2012-04-17 DIAGNOSIS — E119 Type 2 diabetes mellitus without complications: Secondary | ICD-10-CM | POA: Insufficient documentation

## 2012-04-17 DIAGNOSIS — M1710 Unilateral primary osteoarthritis, unspecified knee: Secondary | ICD-10-CM | POA: Insufficient documentation

## 2012-04-17 DIAGNOSIS — IMO0002 Reserved for concepts with insufficient information to code with codable children: Secondary | ICD-10-CM | POA: Insufficient documentation

## 2012-04-17 DIAGNOSIS — M25562 Pain in left knee: Secondary | ICD-10-CM

## 2012-04-17 HISTORY — DX: Essential (primary) hypertension: I10

## 2012-04-17 MED ORDER — HYDROCODONE-ACETAMINOPHEN 5-500 MG PO TABS: 1.0000 | ORAL_TABLET | Freq: Four times a day (QID) | ORAL | Status: AC | PRN

## 2012-04-17 MED ORDER — HYDROMORPHONE HCL PF 1 MG/ML IJ SOLN
1.0000 mg | Freq: Once | INTRAMUSCULAR | Status: AC
  Administered 2012-04-17: 1 mg via INTRAMUSCULAR
  Filled 2012-04-17: qty 1

## 2012-04-17 MED ORDER — IBUPROFEN 800 MG PO TABS: 800.0000 mg | ORAL_TABLET | Freq: Three times a day (TID) | ORAL | Status: AC

## 2012-04-17 NOTE — Discharge Instructions (Signed)
Knee Pain       The knee is the complex joint between your thigh and your lower leg. It is made up of bones, tendons, ligaments, and cartilage. The bones that make up the knee are:   The femur in the thigh.   The tibia and fibula in the lower leg.   The patella or kneecap riding in the groove on the lower femur.  CAUSES   Knee pain is a common complaint with many causes. A few of these causes are:   Injury, such as:   A ruptured ligament or tendon injury.   Torn cartilage.   Medical conditions, such as:   Gout   Arthritis   Infections   Overuse, over training or overdoing a physical activity.  Knee pain can be minor or severe. Knee pain can accompany debilitating injury. Minor knee problems often respond well to self-care measures or get well on their own. More serious injuries may need medical intervention or even surgery.   SYMPTOMS   The knee is complex. Symptoms of knee problems can vary widely. Some of the problems are:   Pain with movement and weight bearing.   Swelling and tenderness.   Buckling of the knee.   Inability to straighten or extend your knee.   Your knee locks and you cannot straighten it.   Warmth and redness with pain and fever.   Deformity or dislocation of the kneecap.  DIAGNOSIS   Determining what is wrong may be very straight forward such as when there is an injury. It can also be challenging because of the complexity of the knee. Tests to make a diagnosis may include:   Your caregiver taking a history and doing a physical exam.   Routine X-rays can be used to rule out other problems. X-rays will not reveal a cartilage tear. Some injuries of the knee can be diagnosed by:   Arthroscopy a surgical technique by which a small video camera is inserted through tiny incisions on the sides of the knee. This procedure is used to examine and repair internal knee joint problems. Tiny instruments can be used during arthroscopy to repair the torn knee cartilage (meniscus).    Arthrography is a radiology technique. A contrast liquid is directly injected into the knee joint. Internal structures of the knee joint then become visible on X-ray film.   An MRI scan is a non x-ray radiology procedure in which magnetic fields and a computer produce two- or three-dimensional images of the inside of the knee. Cartilage tears are often visible using an MRI scanner. MRI scans have largely replaced arthrography in diagnosing cartilage tears of the knee.   Blood work.   Examination of the fluid that helps to lubricate the knee joint (synovial fluid). This is done by taking a sample out using a needle and a syringe.  TREATMENT   The treatment of knee problems depends on the cause. Some of these treatments are:   Depending on the injury, proper casting, splinting, surgery or physical therapy care will be needed.   Give yourself adequate recovery time. Do not overuse your joints. If you begin to get sore during workout routines, back off. Slow down or do fewer repetitions.   For repetitive activities such as cycling or running, maintain your strength and nutrition.   Alternate muscle groups. For example if you are a weight lifter, work the upper body on one day and the lower body the next.   Either tight or weak muscles   do not give the proper support for your knee. Tight or weak muscles do not absorb the stress placed on the knee joint. Keep the muscles surrounding the knee strong.   Take care of mechanical problems.   If you have flat feet, orthotics or special shoes may help. See your caregiver if you need help.   Arch supports, sometimes with wedges on the inner or outer aspect of the heel, can help. These can shift pressure away from the side of the knee most bothered by osteoarthritis.   A brace called an "unloader" brace also may be used to help ease the pressure on the most arthritic side of the knee.    If your caregiver has prescribed crutches, braces, wraps or ice, use as directed. The acronym for this is PRICE. This means protection, rest, ice, compression and elevation.   Nonsteroidal anti-inflammatory drugs (NSAID's), can help relieve pain. But if taken immediately after an injury, they may actually increase swelling. Take NSAID's with food in your stomach. Stop them if you develop stomach problems. Do not take these if you have a history of ulcers, stomach pain or bleeding from the bowel. Do not take without your caregiver's approval if you have problems with fluid retention, heart failure, or kidney problems.   For ongoing knee problems, physical therapy may be helpful.   Glucosamine and chondroitin are over-the-counter dietary supplements. Both may help relieve the pain of osteoarthritis in the knee. These medicines are different from the usual anti-inflammatory drugs. Glucosamine may decrease the rate of cartilage destruction.   Injections of a corticosteroid drug into your knee joint may help reduce the symptoms of an arthritis flare-up. They may provide pain relief that lasts a few months. You may have to wait a few months between injections. The injections do have a small increased risk of infection, water retention and elevated blood sugar levels.   Hyaluronic acid injected into damaged joints may ease pain and provide lubrication. These injections may work by reducing inflammation. A series of shots may give relief for as long as 6 months.   Topical painkillers. Applying certain ointments to your skin may help relieve the pain and stiffness of osteoarthritis. Ask your pharmacist for suggestions. Many over the-counter products are approved for temporary relief of arthritis pain.    In some countries, doctors often prescribe topical NSAID's for relief of chronic conditions such as arthritis and tendinitis. A review of treatment with NSAID creams found that they worked as well as oral medications but without the serious side effects.  PREVENTION   Maintain a healthy weight. Extra pounds put more strain on your joints.   Get strong, stay limber. Weak muscles are a common cause of knee injuries. Stretching is important. Include flexibility exercises in your workouts.   Be smart about exercise. If you have osteoarthritis, chronic knee pain or recurring injuries, you may need to change the way you exercise. This does not mean you have to stop being active. If your knees ache after jogging or playing basketball, consider switching to swimming, water aerobics or other low-impact activities, at least for a few days a week. Sometimes limiting high-impact activities will provide relief.   Make sure your shoes fit well. Choose footwear that is right for your sport.   Protect your knees. Use the proper gear for knee-sensitive activities. Use kneepads when playing volleyball or laying carpet. Buckle your seat belt every time you drive. Most shattered kneecaps occur in car accidents.   Rest when   you are tired.  SEEK MEDICAL CARE IF:   You have knee pain that is continual and does not seem to be getting better.   SEEK IMMEDIATE MEDICAL CARE IF:   Your knee joint feels hot to the touch and you have a high fever.   MAKE SURE YOU:   Understand these instructions.   Will watch your condition.   Will get help right away if you are not doing well or get worse.

## 2012-04-17 NOTE — ED Notes (Signed)
Pt reporting pain and swelling in left knee.  Reports symptoms started on Tuesday. Reports taking Ibuprofen at home with no relief.

## 2012-04-17 NOTE — ED Provider Notes (Signed)
History     CSN: [id number]  Arrival date & time 04/17/12  4098   First MD Initiated Contact with Patient 04/17/12 [phone]      Chief Complaint  Patient presents with  . Knee Pain    (Consider location/radiation/quality/duration/timing/severity/associated sxs/prior treatment) HPI History provided by patient. Has history of gout. Left knee started bothering him a few days ago. Worse tonight and hurts to even lightly touch the area. No trauma. Some mild swelling. No rash. No associated weakness or numbness. No fevers or chills. No nausea vomiting or diarrhea. Pain is sharp in quality and not radiating. Moderate to severe pain. Followed by the [facility] in [facility] Past Medical History  Diagnosis Date  . Diabetes mellitus   . Hypertension     Past Surgical History  Procedure Date  . Knee arthroscopy     History reviewed. No pertinent family history.  History  Substance Use Topics  . Smoking status: Current Some Day Smoker  . Smokeless tobacco: Not on file  . Alcohol Use: No      Review of Systems  Constitutional: Negative for fever and chills.  HENT: Negative for neck pain and neck stiffness.   Eyes: Negative for pain.  Respiratory: Negative for shortness of breath.   Cardiovascular: Negative for chest pain.  Gastrointestinal: Negative for abdominal pain.  Genitourinary: Negative for dysuria.  Musculoskeletal: Negative for myalgias and back pain.  Skin: Negative for rash.  Neurological: Negative for headaches.  All other systems reviewed and are negative.    Allergies  Penicillins  Home Medications   Current Outpatient Rx  Name Route Sig Dispense Refill  . ATENOLOL 25 MG PO TABS Oral Take 25 mg by mouth daily.    Marland Kitchen LISINOPRIL 5 MG PO TABS Oral Take 5 mg by mouth daily.      BP 137/80  Pulse 73  Temp(Src) 98.2 F (36.8 C) (Oral)  Resp 18  Ht 6' (1.829 m)  Wt 205 lb (92.987 kg)  BMI 27.80 kg/m2  SpO2 96%  Physical Exam  Constitutional: He is oriented  to person, place, and time. He appears well-developed and well-nourished.  HENT:  Head: Normocephalic and atraumatic.  Eyes: Conjunctivae and EOM are normal. Pupils are equal, round, and reactive to light.  Neck: Trachea normal. Neck supple. No thyromegaly present.  Cardiovascular: Normal rate, regular rhythm, S1 normal, S2 normal and normal pulses.     No systolic murmur is present   No diastolic murmur is present  Pulses:      Radial pulses are 2+ on the right side, and 2+ on the left side.  Pulmonary/Chest: Effort normal and breath sounds normal. He has no wheezes. He has no rhonchi. He has no rales. He exhibits no tenderness.  Abdominal: Soft. Normal appearance and bowel sounds are normal. There is no tenderness. There is no CVA tenderness and negative Murphy's sign.  Musculoskeletal:       Left lower extremity with tenderness to light palpation over the patella. Mild effusion present. No erythema with mild increased warmth to touch. Decreased range of motion secondary to pain. Is able to bear weight. Skin intact throughout. Distal neurovascular intact. Ankle, foot and hip nontender to palpation. No posterior knee tenderness. No cords. No calf tenderness or swelling  Neurological: He is alert and oriented to person, place, and time. He has normal strength. No cranial nerve deficit or sensory deficit. GCS eye subscore is 4. GCS verbal subscore is 5. GCS motor subscore is 6.  Skin: Skin is warm and dry. No rash noted. He is not diaphoretic.  Psychiatric: His speech is normal.       Cooperative and appropriate    ED Course  Procedures (including critical care time)  Labs Reviewed - No data to display Dg Knee Complete 4 Views Left  04/17/2012  *RADIOLOGY REPORT*  Clinical Data: Pain and swelling  LEFT KNEE - COMPLETE 4+ VIEW  Comparison: none  Findings: No fracture or dislocation of the left knee.  There is joint space narrowing and osteophytosis.  No joint effusion.  IMPRESSION:  1.  No  acute findings. 2.  Tricompartment osteoarthritis.  Original Report Authenticated By: Genevive Bi, M.D.    IM dilaudid - Pain medications provided. Ice. X-ray obtained and reviewed as above.   MDM   Left knee pain with osteoarthritis on x-ray and history of gout. Improved with pain medications provided. No clinical septic joint. Crutches as needed. No systemic symptoms. Plan pain medications and outpatient follow up. Stable for discharge home        Sunnie Nielsen, MD 04/18/12 [phone]

## 2014-10-12 ENCOUNTER — Ambulatory Visit (HOSPITAL_COMMUNITY)
Admission: RE | Admit: 2014-10-12 | Discharge: 2014-10-12 | Disposition: A | Discharge status: Home / Self Care | Payer: Disability Insurance | Source: Ambulatory Visit | Attending: Preventative Medicine | Admitting: Preventative Medicine

## 2014-10-12 ENCOUNTER — Other Ambulatory Visit (HOSPITAL_COMMUNITY): Payer: Self-pay | Admitting: *Deleted

## 2014-10-12 DIAGNOSIS — M223X1 Other derangements of patella, right knee: Secondary | ICD-10-CM | POA: Insufficient documentation

## 2014-10-12 DIAGNOSIS — M25561 Pain in right knee: Secondary | ICD-10-CM

## 2014-10-12 DIAGNOSIS — M79601 Pain in right arm: Secondary | ICD-10-CM

## 2014-10-12 DIAGNOSIS — M545 Low back pain: Secondary | ICD-10-CM | POA: Insufficient documentation

## 2014-10-12 DIAGNOSIS — M79641 Pain in right hand: Secondary | ICD-10-CM | POA: Insufficient documentation

## 2016-04-22 ENCOUNTER — Emergency Department (HOSPITAL_COMMUNITY)
Admission: EM | Admit: 2016-04-22 | Discharge: 2016-04-22 | Disposition: A | Discharge status: Home / Self Care | Payer: Medicaid Other | Attending: Emergency Medicine | Admitting: Emergency Medicine

## 2016-04-22 ENCOUNTER — Encounter (HOSPITAL_COMMUNITY): Payer: Self-pay | Admitting: Emergency Medicine

## 2016-04-22 DIAGNOSIS — F17200 Nicotine dependence, unspecified, uncomplicated: Secondary | ICD-10-CM | POA: Diagnosis not present

## 2016-04-22 DIAGNOSIS — I10 Essential (primary) hypertension: Secondary | ICD-10-CM | POA: Insufficient documentation

## 2016-04-22 DIAGNOSIS — M109 Gout, unspecified: Secondary | ICD-10-CM | POA: Diagnosis not present

## 2016-04-22 DIAGNOSIS — E119 Type 2 diabetes mellitus without complications: Secondary | ICD-10-CM | POA: Insufficient documentation

## 2016-04-22 DIAGNOSIS — M25561 Pain in right knee: Secondary | ICD-10-CM | POA: Diagnosis present

## 2016-04-22 LAB — SYNOVIAL CELL COUNT + DIFF, W/ CRYSTALS
CRYSTALS FLUID: NONE SEEN
Eosinophils-Synovial: 0 % (ref 0–1)
LYMPHOCYTES-SYNOVIAL FLD: 0 % (ref 0–20)
MONOCYTE-MACROPHAGE-SYNOVIAL FLUID: 6 % — AB (ref 50–90)
Neutrophil, Synovial: 94 % — ABNORMAL HIGH (ref 0–25)
OTHER CELLS-SYN: 0
WBC, Synovial: 1008 /mm3 — ABNORMAL HIGH (ref 0–200)

## 2016-04-22 LAB — GRAM STAIN

## 2016-04-22 MED ORDER — LIDOCAINE-EPINEPHRINE 2 %-1:100000 IJ SOLN
30.0000 mL | Freq: Once | INTRAMUSCULAR | Status: AC
  Administered 2016-04-22: 30 mL via INTRADERMAL

## 2016-04-22 MED ORDER — OXYCODONE-ACETAMINOPHEN 5-325 MG PO TABS
2.0000 | ORAL_TABLET | Freq: Once | ORAL | Status: AC
  Administered 2016-04-22: 2 via ORAL

## 2016-04-22 NOTE — ED Provider Notes (Signed)
CSN: [id number]     Arrival date & time 04/22/16  0113 History  Chief complaint: knee Pain    (Consider location/radiation/quality/duration/timing/severity/associated sxs/prior Treatment) HPI  This 50 year old male with history of diabetes and hypertension who presents with right knee pain. Patient also reports a history of gout. States that he has had increasing right knee pain over the last 1-2 days. Has also noted swelling. Denies fevers. He has not taken anything at home for his pain. States that this is consistent with his prior gouty flares.  Past Medical History  Diagnosis Date  . Diabetes mellitus   . Hypertension    Past Surgical History  Procedure Laterality Date  . Knee arthroscopy     No family history on file. Social History  Substance Use Topics  . Smoking status: Current Some Day Smoker  . Smokeless tobacco: None  . Alcohol Use: No    Review of Systems  Constitutional: Negative for fever.  Musculoskeletal:       Right knee pain and swelling  Skin: Negative for color change.  All other systems reviewed and are negative.     Allergies  Penicillins  Home Medications   Prior to Admission medications   Medication Sig Start Date End Date Taking? Authorizing Provider  atenolol (TENORMIN) 25 MG tablet Take 25 mg by mouth daily.    Historical Provider, MD  lisinopril (PRINIVIL,ZESTRIL) 5 MG tablet Take 5 mg by mouth daily.    Historical Provider, MD   BP 152/84 mmHg  Pulse 69  Temp(Src) 97.8 F (36.6 C) (Oral)  Resp 18  Ht 6' (1.829 m)  Wt 212 lb (96.163 kg)  BMI 28.75 kg/m2  SpO2 95% Physical Exam  Constitutional: He is oriented to person, place, and time. He appears well-developed and well-nourished. No distress.  HENT:  Head: Normocephalic and atraumatic.  Cardiovascular: Normal rate and regular rhythm.   Pulmonary/Chest: Effort normal. No respiratory distress.  Musculoskeletal: He exhibits no edema.  Diffuse prominent swelling of the right  knee, boggy, limited range of motion secondary to pain, no overlying skin changes, erythema, or warmth, scarring noted  Neurological: He is alert and oriented to person, place, and time.  Skin: Skin is warm and dry.  Psychiatric: He has a normal mood and affect.  Nursing note and vitals reviewed.   ED Course  Procedures (including critical care time)  ARTHOCENTESIS Performed by: Shon Baton Consent: Verbal consent obtained. Risks and benefits: risks, benefits and alternatives were discussed Consent given by: patient Required items: required blood products, implants, devices, and special equipment available Patient identity confirmed: verbally with patient Time out: Immediately prior to procedure a "time out" was called to verify the correct patient, procedure, equipment, support staff and site/side marked as required. Indications: joint swelling Joint: right knee Local anesthesia used: lid w/ epid Preparation: Patient was prepped and draped in the usual sterile fashion. Aspirate appearance: straw colored Aspirate amount: 30 ml Patient tolerance: Patient tolerated the procedure well with no immediate complications.    Labs Review Labs Reviewed - No data to display  Imaging Review No results found. I have personally reviewed and evaluated these images and lab results as part of my medical decision-making.   EKG Interpretation None      MDM   Final diagnoses:  Gout of right knee, unspecified cause, unspecified chronicity    Patient presents with right knee pain and swelling. No systemic symptoms. Well-appearing. Obvious boggy swelling to the right knee. He does have pain with  range of motion. No overlying skin changes to suggest infection. Arthrocentesis was performed both for diagnostic and therapeutic purposes. Patient reports improvement of pain with removal of 30 mL of fluid.  Gram stain negative. Will treat patient for gout with indomethacin and  Percocet.  After history, exam, and medical workup I feel the patient has been appropriately medically screened and is safe for discharge home. Pertinent diagnoses were discussed with the patient. Patient was given return precautions.     Shon Batonourtney F Sheran Newstrom, MD 04/22/16 [phone]

## 2016-04-22 NOTE — ED Notes (Signed)
C/o right knee pain since Sunday.

## 2016-04-27 LAB — CULTURE, BODY FLUID-BOTTLE: CULTURE: NO GROWTH

## 2016-04-27 LAB — CULTURE, BODY FLUID W GRAM STAIN -BOTTLE

## 2017-03-26 ENCOUNTER — Encounter (HOSPITAL_COMMUNITY): Payer: Self-pay | Admitting: Emergency Medicine

## 2017-03-26 ENCOUNTER — Emergency Department (HOSPITAL_COMMUNITY)
Admission: EM | Admit: 2017-03-26 | Discharge: 2017-03-26 | Disposition: A | Discharge status: Home / Self Care | Payer: Medicaid Other | Attending: Emergency Medicine | Admitting: Emergency Medicine

## 2017-03-26 DIAGNOSIS — F17200 Nicotine dependence, unspecified, uncomplicated: Secondary | ICD-10-CM | POA: Insufficient documentation

## 2017-03-26 DIAGNOSIS — Z79899 Other long term (current) drug therapy: Secondary | ICD-10-CM | POA: Insufficient documentation

## 2017-03-26 DIAGNOSIS — E119 Type 2 diabetes mellitus without complications: Secondary | ICD-10-CM | POA: Insufficient documentation

## 2017-03-26 DIAGNOSIS — M25522 Pain in left elbow: Secondary | ICD-10-CM | POA: Diagnosis not present

## 2017-03-26 DIAGNOSIS — I10 Essential (primary) hypertension: Secondary | ICD-10-CM | POA: Insufficient documentation

## 2017-03-26 HISTORY — DX: Unspecified osteoarthritis, unspecified site: M19.90

## 2017-03-26 MED ORDER — INDOMETHACIN 50 MG PO CAPS: 50.0000 mg | ORAL_CAPSULE | Freq: Two times a day (BID) | ORAL | 0 refills | Status: DC

## 2017-03-26 MED ORDER — KETOROLAC TROMETHAMINE 60 MG/2ML IM SOLN
60.0000 mg | Freq: Once | INTRAMUSCULAR | Status: AC
  Administered 2017-03-26: 60 mg via INTRAMUSCULAR
  Filled 2017-03-26: qty 2

## 2017-03-26 MED ORDER — OXYCODONE-ACETAMINOPHEN 5-325 MG PO TABS: 1.0000 | ORAL_TABLET | ORAL | 0 refills | Status: AC | PRN

## 2017-03-26 MED ORDER — OXYCODONE-ACETAMINOPHEN 5-325 MG PO TABS
1.0000 | ORAL_TABLET | Freq: Once | ORAL | Status: AC
  Administered 2017-03-26: 1 via ORAL
  Filled 2017-03-26: qty 1

## 2017-03-26 NOTE — ED Notes (Signed)
Pt reports having a flare up of his gout in his left arm, cms intact

## 2017-03-26 NOTE — Discharge Instructions (Signed)
Follow-up with your primary doctor for recheck °

## 2017-03-26 NOTE — ED Provider Notes (Signed)
[facility]-EMERGENCY DEPT Provider Note   CSN: [id number] Arrival date & time: 03/26/17  1300     History   Chief Complaint Chief Complaint  Patient presents with  . Arm Pain    L arm pain    HPI Kristopher Hogan is a 51 y.o. male.  HPI   Kristopher Hogan is a 51 y.o. male who presents to the Emergency Department complaining of Left elbow pain for several days. He describes the pain as a constant throbbing sensation to the lateral elbow. Pain is worse with extension of the arm. He reports a history of gout to multiple joints including his left elbow, and states this pain is similar to previous episodes of gout. He states that he is taken naproxen without relief. He denies known injury. He also denies any pain to his neck or shoulder, redness, weakness, numbness, swelling, or chest pain.   Past Medical History:  Diagnosis Date  . Arthritis   . Diabetes mellitus   . Hypertension     There are no active problems to display for this patient.   Past Surgical History:  Procedure Laterality Date  . KNEE ARTHROSCOPY         Home Medications    Prior to Admission medications   Medication Sig Start Date End Date Taking? Authorizing Provider  atenolol (TENORMIN) 25 MG tablet Take 25 mg by mouth daily.    Historical Provider, MD  lisinopril (PRINIVIL,ZESTRIL) 5 MG tablet Take 5 mg by mouth daily.    Historical Provider, MD    Family History History reviewed. No pertinent family history.  Social History Social History  Substance Use Topics  . Smoking status: Current Some Day Smoker  . Smokeless tobacco: Never Used  . Alcohol use No     Allergies   Penicillins   Review of Systems Review of Systems  Constitutional: Negative for chills and fever.  Respiratory: Negative for shortness of breath.   Cardiovascular: Negative for chest pain.  Gastrointestinal: Negative for abdominal pain and vomiting.  Musculoskeletal: Positive for arthralgias. Negative for joint swelling  and neck pain.  Skin: Negative for color change and wound.  Neurological: Negative for weakness and numbness.  All other systems reviewed and are negative.    Physical Exam Updated Vital Signs BP (!) 166/69   Pulse 60   Temp 97.9 F (36.6 C) (Oral) Comment: Simultaneous filing. User may not have seen previous data. Comment (Src): Simultaneous filing. User may not have seen previous data.  Resp 18   Ht 6' (1.829 m)   Wt 97.5 kg   SpO2 100%   BMI 29.16 kg/m   Physical Exam  Constitutional: He is oriented to person, place, and time. He appears well-developed and well-nourished. No distress.  HENT:  Head: Normocephalic and atraumatic.  Neck: Normal range of motion, full passive range of motion without pain and phonation normal. No spinous process tenderness and no muscular tenderness present.  Cardiovascular: Normal rate, regular rhythm, normal heart sounds and intact distal pulses.   Pulmonary/Chest: Effort normal and breath sounds normal. He exhibits no tenderness.  Musculoskeletal: He exhibits tenderness. He exhibits no edema.       Left elbow: He exhibits decreased range of motion. He exhibits no swelling. Tenderness found. Lateral epicondyle tenderness noted.  tto of the lateral left elbow.  No erythema, edema or bony deformity.  Patient has full ROM. Compartments soft.  Neurological: He is alert and oriented to person, place, and time. He exhibits normal muscle  tone. Coordination normal.  Skin: Skin is warm and dry.  Nursing note and vitals reviewed.    ED Treatments / Results  Labs (all labs ordered are listed, but only abnormal results are displayed) Labs Reviewed - No data to display  EKG  EKG Interpretation None       Radiology No results found.   Procedures Procedures (including critical care time)  Medications Ordered in ED Medications  ketorolac (TORADOL) injection 60 mg (60 mg Intramuscular Given 03/26/17 1411)  oxyCODONE-acetaminophen  (PERCOCET/ROXICET) 5-325 MG per tablet 1 tablet (1 tablet Oral Given 03/26/17 1411)     Initial Impression / Assessment and Plan / ED Course  I have reviewed the triage vital signs and the nursing notes.  Pertinent labs & imaging results that were available during my care of the patient were reviewed by me and considered in my medical decision making (see chart for details).     Pt with hx of gout and previous gout to the elbow.  No concerning sx's for septic joint.  NV intact. rx for percocet and indomethacin  Final Clinical Impressions(s) / ED Diagnoses   Final diagnoses:  Left elbow pain    New Prescriptions Discharge Medication List as of 03/26/2017  2:39 PM    START taking these medications   Details  indomethacin (INDOCIN) 50 MG capsule Take 1 capsule (50 mg total) by mouth 2 (two) times daily with a meal. Until pain tolerable., Starting Fri 03/26/2017, Print    oxyCODONE-acetaminophen (PERCOCET/ROXICET) 5-325 MG tablet Take 1 tablet by mouth every 4 (four) hours as needed., Starting Fri 03/26/2017, Print         Minsa Weddington [place], PA-C 03/29/17 2142    Donnetta Hutching, MD 03/30/17 1229

## 2017-05-12 ENCOUNTER — Encounter (HOSPITAL_COMMUNITY): Payer: Self-pay | Admitting: Emergency Medicine

## 2017-05-12 ENCOUNTER — Emergency Department (HOSPITAL_COMMUNITY)
Admission: EM | Admit: 2017-05-12 | Discharge: 2017-05-12 | Disposition: A | Discharge status: Home / Self Care | Payer: Non-veteran care | Attending: Emergency Medicine | Admitting: Emergency Medicine

## 2017-05-12 DIAGNOSIS — Z79899 Other long term (current) drug therapy: Secondary | ICD-10-CM | POA: Diagnosis not present

## 2017-05-12 DIAGNOSIS — T1591XA Foreign body on external eye, part unspecified, right eye, initial encounter: Secondary | ICD-10-CM

## 2017-05-12 DIAGNOSIS — H1011 Acute atopic conjunctivitis, right eye: Secondary | ICD-10-CM | POA: Insufficient documentation

## 2017-05-12 DIAGNOSIS — H1089 Other conjunctivitis: Secondary | ICD-10-CM

## 2017-05-12 DIAGNOSIS — H5711 Ocular pain, right eye: Secondary | ICD-10-CM | POA: Diagnosis present

## 2017-05-12 DIAGNOSIS — F17210 Nicotine dependence, cigarettes, uncomplicated: Secondary | ICD-10-CM | POA: Diagnosis not present

## 2017-05-12 MED ORDER — ERYTHROMYCIN 5 MG/GM OP OINT
TOPICAL_OINTMENT | Freq: Once | OPHTHALMIC | Status: AC
  Administered 2017-05-12: 1 via OPHTHALMIC
  Filled 2017-05-12: qty 3.5

## 2017-05-12 MED ORDER — TETRACAINE HCL 0.5 % OP SOLN
OPHTHALMIC | Status: AC
  Filled 2017-05-12: qty 4

## 2017-05-12 MED ORDER — FLUORESCEIN SODIUM 0.6 MG OP STRP
1.0000 | ORAL_STRIP | Freq: Once | OPHTHALMIC | Status: AC
  Administered 2017-05-12: 1 via OPHTHALMIC
  Filled 2017-05-12: qty 1

## 2017-05-12 MED ORDER — KETOROLAC TROMETHAMINE 0.5 % OP SOLN
1.0000 [drp] | Freq: Once | OPHTHALMIC | Status: AC
  Administered 2017-05-12: 1 [drp] via OPHTHALMIC
  Filled 2017-05-12: qty 5

## 2017-05-12 MED ORDER — TETRACAINE HCL 0.5 % OP SOLN
1.0000 [drp] | Freq: Once | OPHTHALMIC | Status: AC
  Administered 2017-05-12: 1 [drp] via OPHTHALMIC
  Filled 2017-05-12: qty 4

## 2017-05-12 NOTE — ED Triage Notes (Signed)
Woke up 2 days ago with left eye pain, red, no relief with eye drops, " feels like there is something in there"

## 2017-05-12 NOTE — ED Notes (Signed)
Slit lamp and woods lamp at bedside

## 2017-05-12 NOTE — ED Provider Notes (Signed)
[facility]EMER[facility]Y DEPT Provider Note   CSN: [id number] Arrival date & time: 05/12/17  1602     History   Chief Complaint Chief Complaint  Patient presents with  . Eye Pain    HPI Kristopher Hogan is a 51 y.o. male who woke yesterday am with sensation of foreign body in his right eye.  He has tried to flush the eye and explore it but been able to find the source of pain which has felt to be underneath the upper outer lid.  He denies injury, scratches, etc and does not wear contacts, uses readers only.  Additionally he denies any vision changes, left eye pain, photophobia or drainage from the eye.  HPI  Past Medical History:  Diagnosis Date  . Arthritis   . Diabetes mellitus   . Hypertension     There are no active problems to display for this patient.   Past Surgical History:  Procedure Laterality Date  . KNEE ARTHROSCOPY         Home Medications    Prior to Admission medications   Medication Sig Start Date End Date Taking? Authorizing Provider  atenolol (TENORMIN) 25 MG tablet Take 25 mg by mouth daily.    [provider]  indomethacin (INDOCIN) 50 MG capsule Take 1 capsule (50 mg total) by mouth 2 (two) times daily with a meal. Until pain tolerable. 03/26/17   Triplett, Tammy, PA-C  lisinopril (PRINIVIL,ZESTRIL) 5 MG tablet Take 5 mg by mouth daily.    [provider]  oxyCODONE-acetaminophen (PERCOCET/ROXICET) 5-325 MG tablet Take 1 tablet by mouth every 4 (four) hours as needed. 03/26/17   Triplett, Babette Relicammy, PA-C    Family History No family history on file.  Social History Social History  Substance Use Topics  . Smoking status: Current Some Day Smoker    Packs/day: 1.00    Types: Cigars  . Smokeless tobacco: Never Used  . Alcohol use No     Allergies   Penicillins   Review of Systems Review of Systems  Constitutional: Negative for fever.  HENT: Negative for congestion, ear pain, sinus pain and sore throat.   Eyes: Positive for pain  and redness. Negative for photophobia, discharge and visual disturbance.  Respiratory: Negative.   Cardiovascular: Negative.   Gastrointestinal: Negative for abdominal pain and nausea.  Genitourinary: Negative.   Skin: Negative.  Negative for rash and wound.  Neurological: Negative.   Psychiatric/Behavioral: Negative.      Physical Exam Updated Vital Signs BP (!) 147/84 (BP Location: Right Arm)   Pulse 61   Temp 99.4 F (37.4 C) (Oral)   Resp 18   Ht 6' (1.829 m)   Wt 96.2 kg (212 lb)   SpO2 98%   BMI 28.75 kg/m   Physical Exam  Constitutional: He is oriented to person, place, and time. He appears well-developed and well-nourished.  HENT:  Head: Normocephalic and atraumatic.  Right Ear: Tympanic membrane and ear canal normal.  Left Ear: Tympanic membrane and ear canal normal.  Nose: No mucosal edema or rhinorrhea.  Mouth/Throat: Uvula is midline, oropharynx is clear and moist and mucous membranes are normal. No oropharyngeal exudate, posterior oropharyngeal edema, posterior oropharyngeal erythema or tonsillar abscesses.  Eyes: EOM and lids are normal. Pupils are equal, round, and reactive to light. Lids are everted and swept, no foreign bodies found. Right eye exhibits no chemosis, no discharge and no exudate. No foreign body present in the right eye. Right conjunctiva is injected.  Slit lamp exam:  The right eye shows no corneal abrasion, no corneal flare, no corneal ulcer, no foreign body, no hyphema and no fluorescein uptake.  Visual Acuity  Bilateral Distance: 20/20 R Distance: 20/25 L Distance: 20/20     Cardiovascular: Normal rate and normal heart sounds.   Pulmonary/Chest: Effort normal. No respiratory distress. He has no wheezes. He has no rales.  Abdominal: Soft. There is no tenderness.  Musculoskeletal: Normal range of motion.  Neurological: He is alert and oriented to person, place, and time.  Skin: Skin is warm and dry. No rash noted.  Psychiatric: He has  a normal mood and affect.     ED Treatments / Results  Labs (all labs ordered are listed, but only abnormal results are displayed) Labs Reviewed - No data to display  EKG  EKG Interpretation None       Radiology No results found.  Procedures Procedures (including critical care time)  Pt given tetracaine with complete resolution of pain.  Eye flushed and tetracaine allowed to wear away.  No further fb sensation.   Medications Ordered in ED Medications  tetracaine (PONTOCAINE) 0.5 % ophthalmic solution 1 drop (1 drop Both Eyes Given by Other 05/12/17 1719)  fluorescein ophthalmic strip 1 strip (1 strip Both Eyes Given by Other 05/12/17 1719)  ketorolac (ACULAR) 0.5 % ophthalmic solution 1 drop (1 drop Right Eye Given 05/12/17 1916)  erythromycin ophthalmic ointment (1 application Right Eye Given 05/12/17 1916)     Initial Impression / Assessment and Plan / ED Course  I have reviewed the triage vital signs and the nursing notes.  Pertinent labs & imaging results that were available during my care of the patient were reviewed by me and considered in my medical decision making (see chart for details).     Pt with moderate conjunctivitis. Ketorolac for inflammation given.  Erythromycin ointment for sx relief.  Prn f/u anticipated.  Pt advised recheck by ophthalmology if not resolved over the next 5 days, sooner for any worsened sx.  Referral given.   Final Clinical Impressions(s) / ED Diagnoses   Final diagnoses:  Foreign body of right eye, initial encounter  Other conjunctivitis of right eye    New Prescriptions Discharge Medication List as of 05/12/2017  7:14 PM       Burgess Amor, PA-C 05/13/17 1635    Burgess Amor, PA-C 05/13/17 1636    Dione Booze, MD 05/14/17 [phone]

## 2017-05-12 NOTE — Discharge Instructions (Signed)
Apply one drop of the ketorolac drop in your right eye every 4 hours if needed for pain (this is the medicine that will help inflammation.  Apply the antibiotic ointment after the drop, waiting about 10 minutes allowing the drop to absorb first.  Avoid rubbing your eye.  Return here or call the eye doctor listed if this does not resolve your symptoms over the next several days.

## 2017-05-12 NOTE — ED Notes (Signed)
Pt ambulatory to waiting room. Pt verbalized understanding of discharge instructions.   

## 2019-06-23 ENCOUNTER — Encounter (HOSPITAL_COMMUNITY): Payer: Self-pay | Admitting: Emergency Medicine

## 2019-06-23 ENCOUNTER — Other Ambulatory Visit: Payer: Self-pay

## 2019-06-23 ENCOUNTER — Emergency Department (HOSPITAL_COMMUNITY): Payer: No Typology Code available for payment source

## 2019-06-23 ENCOUNTER — Emergency Department (HOSPITAL_COMMUNITY)
Admission: EM | Admit: 2019-06-23 | Discharge: 2019-06-24 | Disposition: A | Discharge status: Home / Self Care | Payer: No Typology Code available for payment source | Attending: Emergency Medicine | Admitting: Emergency Medicine

## 2019-06-23 DIAGNOSIS — M25461 Effusion, right knee: Secondary | ICD-10-CM | POA: Insufficient documentation

## 2019-06-23 DIAGNOSIS — E119 Type 2 diabetes mellitus without complications: Secondary | ICD-10-CM | POA: Insufficient documentation

## 2019-06-23 DIAGNOSIS — I10 Essential (primary) hypertension: Secondary | ICD-10-CM | POA: Diagnosis not present

## 2019-06-23 DIAGNOSIS — F17290 Nicotine dependence, other tobacco product, uncomplicated: Secondary | ICD-10-CM | POA: Insufficient documentation

## 2019-06-23 DIAGNOSIS — Z79899 Other long term (current) drug therapy: Secondary | ICD-10-CM | POA: Diagnosis not present

## 2019-06-23 DIAGNOSIS — M25561 Pain in right knee: Secondary | ICD-10-CM | POA: Diagnosis present

## 2019-06-23 HISTORY — DX: Gout, unspecified: M10.9

## 2019-06-23 MED ORDER — LIDOCAINE-EPINEPHRINE (PF) 2 %-1:200000 IJ SOLN
INTRAMUSCULAR | Status: AC
  Administered 2019-06-23: 20 mL
  Filled 2019-06-23: qty 20

## 2019-06-23 MED ORDER — LIDOCAINE-EPINEPHRINE (PF) 2 %-1:200000 IJ SOLN
20.0000 mL | Freq: Once | INTRAMUSCULAR | Status: AC
  Administered 2019-06-23: 20 mL

## 2019-06-23 NOTE — ED Notes (Signed)
Patient transported to X-ray 

## 2019-06-23 NOTE — ED Notes (Signed)
Pt back from x-ray.

## 2019-06-23 NOTE — ED Triage Notes (Signed)
Pt c/o fluid build up in right knee since Wednesday.

## 2019-06-24 LAB — SYNOVIAL CELL COUNT + DIFF, W/ CRYSTALS
Eosinophils-Synovial: 0 % (ref 0–1)
Lymphocytes-Synovial Fld: 0 % (ref 0–20)
Monocyte-Macrophage-Synovial Fluid: 21 % — ABNORMAL LOW (ref 50–90)
Neutrophil, Synovial: 79 % — ABNORMAL HIGH (ref 0–25)
Other Cells-SYN: 0
WBC, Synovial: 1150 /mm3 — ABNORMAL HIGH (ref 0–200)

## 2019-06-24 LAB — GRAM STAIN

## 2019-06-24 MED ORDER — NAPROXEN 500 MG PO TABS: 500.0000 mg | ORAL_TABLET | Freq: Two times a day (BID) | ORAL | 0 refills | Status: DC

## 2019-06-24 MED ORDER — KETOROLAC TROMETHAMINE 30 MG/ML IJ SOLN
30.0000 mg | Freq: Once | INTRAMUSCULAR | Status: AC
  Administered 2019-06-24: 30 mg via INTRAMUSCULAR
  Filled 2019-06-24: qty 1

## 2019-06-24 NOTE — ED Provider Notes (Addendum)
[facility] Provider Note   CSN: [id number] Arrival date & time: 06/23/19  2217    History   Chief Complaint Chief Complaint  Patient presents with  . Knee Pain    fluid    HPI Kristopher FinnerKeith L Hogan is a 53 y.o. male.     HPI This is a 53 year old male with history of gout, arthritis who presents with right knee pain.  Patient reports knee pain and swelling since Wednesday.  He has not taken anything for his pain.  He rates his pain 8 out of 10.  He denies any injury.  He denies any fever or overlying skin changes.  He has similar symptoms in the past with his knee.  He is also had prior surgery to the right knee.  Denies numbness or tingling.  Past Medical History:  Diagnosis Date  . Arthritis   . Diabetes mellitus   . Gout   . Hypertension     There are no active problems to display for this patient.   Past Surgical History:  Procedure Laterality Date  . KNEE ARTHROSCOPY          Home Medications    Prior to Admission medications   Medication Sig Start Date End Date Taking? Authorizing Provider  atenolol (TENORMIN) 25 MG tablet Take 25 mg by mouth daily.    [provider]  indomethacin (INDOCIN) 50 MG capsule Take 1 capsule (50 mg total) by mouth 2 (two) times daily with a meal. Until pain tolerable. 03/26/17   Triplett, Tammy, PA-C  lisinopril (PRINIVIL,ZESTRIL) 5 MG tablet Take 5 mg by mouth daily.    [provider]  naproxen (NAPROSYN) 500 MG tablet Take 1 tablet (500 mg total) by mouth 2 (two) times daily. 06/24/19   [name], [name], MD  oxyCODONE-acetaminophen (PERCOCET/ROXICET) 5-325 MG tablet Take 1 tablet by mouth every 4 (four) hours as needed. 03/26/17   Pauline Ausriplett, Tammy, PA-C    Family History History reviewed. No pertinent family history.  Social History Social History   Tobacco Use  . Smoking status: Current Some Day Smoker    Packs/day: 1.00    Types: Cigars  . Smokeless tobacco: Never Used  Substance  Use Topics  . Alcohol use: No  . Drug use: No     Allergies   Penicillins   Review of Systems Review of Systems  Constitutional: Negative for fever.  Respiratory: Negative for shortness of breath.   Cardiovascular: Negative for chest pain.  Gastrointestinal: Negative for nausea and vomiting.  Musculoskeletal:       Right knee pain  Skin: Negative for color change.  Neurological: Negative for weakness and numbness.  All other systems reviewed and are negative.    Physical Exam Updated Vital Signs BP (!) 163/81   Pulse 64   Temp 98.2 F (36.8 C) (Oral)   Resp 18   Ht 1.829 m (6')   Wt 93.4 kg   SpO2 99%   BMI 27.94 kg/m   Physical Exam Vitals signs and nursing note reviewed.  Constitutional:      Appearance: He is well-developed. He is not ill-appearing.  HENT:     Head: Normocephalic and atraumatic.  Eyes:     Pupils: Pupils are equal, round, and reactive to light.  Cardiovascular:     Rate and Rhythm: Normal rate and regular rhythm.  Pulmonary:     Effort: Pulmonary effort is normal. No respiratory distress.  Musculoskeletal:     Comments: Focused exam  of the right knee with large effusion noted mostly medially and anteriorly, no overlying skin changes, no significant tenderness to palpation, limited range of motion secondary to pain, 2+ DP pulse  Skin:    General: Skin is warm and dry.  Neurological:     Mental Status: He is alert and oriented to person, place, and time.  Psychiatric:        Mood and Affect: Mood normal.      ED Treatments / Results  Labs (all labs ordered are listed, but only abnormal results are displayed) Labs Reviewed  SYNOVIAL CELL COUNT + DIFF, W/ CRYSTALS - Abnormal; Notable for the following components:      Result Value   Color, Synovial YELLOW (*)    Appearance-Synovial HAZY (*)    WBC, Synovial 1,150 (*)    Neutrophil, Synovial 79 (*)    Monocyte-Macrophage-Synovial Fluid 21 (*)    All other components within normal  limits  GRAM STAIN  CULTURE, BODY FLUID-BOTTLE    EKG None  Radiology Dg Knee Complete 4 Views Right  Result Date: 06/23/2019 CLINICAL DATA:  Right knee pain and swelling. EXAM: RIGHT KNEE - COMPLETE 4+ VIEW COMPARISON:  Radiograph 06/03/2019, additional priors FINDINGS: Marked anterior soft tissue edema/soft tissue thickening is unchanged from prior exams. Irregular calcifications in the region of soft tissue thickening, unchanged. 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 Osteoarthritis with lateral tibiofemoral joint space narrowing. Subchondral irregularity of the patella. Remote proximal fibular fracture. No acute fracture, dislocation, or bony destructive change. IMPRESSION: 1. Marked anterior soft tissue edema/soft tissue thickening, unchanged from prior exams. 2. 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 Osteoarthritis. No acute osseous abnormality. Electronically Signed   By: Aadit Rake M.D.   On: 06/23/2019 23:14    Procedures .Joint Aspiration/Arthrocentesis  Date/Time: 06/24/2019 12:30 AM Performed by: Merryl Hacker, MD Authorized by: Merryl Hacker, MD   Consent:    Consent obtained:  Written   Consent given by:  Patient   Risks discussed:  Bleeding, infection and pain   Alternatives discussed:  No treatment Location:    Location:  Knee   Knee:  R knee Anesthesia (see MAR for exact dosages):    Anesthesia method:  Local infiltration   Local anesthetic:  Lidocaine 2% WITH epi Procedure details:    Preparation: Patient was prepped and draped in usual sterile fashion     Needle gauge:  18 G   Ultrasound guidance: no     Approach:  Medial   Aspirate amount:  120 cc   Aspirate characteristics:  Serous   Steroid injected: no     Specimen collected: yes   Post-procedure details:    Dressing:  Adhesive bandage   Patient tolerance of procedure:  Tolerated well, no immediate complications   (including critical care time)  Medications Ordered in ED Medications  lidocaine-EPINEPHrine  (XYLOCAINE W/EPI) 2 %-1:200000 (PF) injection 20 mL (20 mLs Infiltration Given by Other 06/23/19 2356)  ketorolac (TORADOL) 30 MG/ML injection 30 mg (30 mg Intramuscular Given 06/24/19 0024)     Initial Impression / Assessment and Plan / ED Course  I have reviewed the triage vital signs and the nursing notes.  Pertinent labs & imaging results that were available during my care of the patient were reviewed by me and considered in my medical decision making (see chart for details).        Patient presents with large effusion to the right knee.  Reports increasing pain.  He is overall nontoxic-appearing and afebrile.  Vital signs  notable for blood pressure 172/80.  Knee is not particularly warm or red.  He has range of motion but it is limited secondary to pain and effusion.  Given the size of the effusion, feel patient would have significant pain relief with aspiration.  I have low suspicion at this time for septic joint.  He has had prior joint effusions without any growth or bacteria.  Joint was aspirated without any complication.  Patient had significant relief of pain.  Fluid was sent but patient was discharged home prior to results as I have low suspicion that this will yield any significant changes in his management clinically.  Patient was given Toradol.  Recommend ice, compression, elevation, and anti-inflammatories for pain relief.  After history, exam, and medical workup I feel the patient has been appropriately medically screened and is safe for discharge home. Pertinent diagnoses were discussed with the patient. Patient was given return precautions.  7:30 AM Synovial fluid analysis reviewed.  Reassuring.  Appears inflammatory.  Final Clinical Impressions(s) / ED Diagnoses   Final diagnoses:  Effusion of right knee    ED Discharge Orders         Ordered    naproxen (NAPROSYN) 500 MG tablet  2 times daily     06/24/19 0022           Tasnia Spegal, [name], MD 06/24/19 [id number]     Shon BatonHorton, Humaira Sculley F, MD 06/24/19 0730

## 2019-06-24 NOTE — Discharge Instructions (Addendum)
You were seen today for an effusion of the right knee.  Keep the knee iced and elevated for comfort.  Take naproxen as needed for pain.  If you develop fevers, redness, any new or worsening symptoms you should be reevaluated.

## 2019-06-24 NOTE — ED Notes (Signed)
Right knee dressed with 4x4's for drainage with lose cling.

## 2019-06-24 NOTE — ED Notes (Signed)
Written consent completed before procedure and placed in patient's medical record.

## 2019-06-29 LAB — CULTURE, BODY FLUID W GRAM STAIN -BOTTLE: Culture: NO GROWTH

## 2019-07-07 ENCOUNTER — Other Ambulatory Visit: Payer: Self-pay

## 2019-07-07 ENCOUNTER — Encounter (HOSPITAL_COMMUNITY): Payer: Self-pay

## 2019-07-07 ENCOUNTER — Emergency Department (HOSPITAL_COMMUNITY)
Admission: EM | Admit: 2019-07-07 | Discharge: 2019-07-08 | Disposition: A | Discharge status: Home / Self Care | Payer: No Typology Code available for payment source | Attending: Emergency Medicine | Admitting: Emergency Medicine

## 2019-07-07 DIAGNOSIS — N289 Disorder of kidney and ureter, unspecified: Secondary | ICD-10-CM

## 2019-07-07 DIAGNOSIS — I129 Hypertensive chronic kidney disease with stage 1 through stage 4 chronic kidney disease, or unspecified chronic kidney disease: Secondary | ICD-10-CM | POA: Insufficient documentation

## 2019-07-07 DIAGNOSIS — E1122 Type 2 diabetes mellitus with diabetic chronic kidney disease: Secondary | ICD-10-CM | POA: Diagnosis not present

## 2019-07-07 DIAGNOSIS — N189 Chronic kidney disease, unspecified: Secondary | ICD-10-CM | POA: Diagnosis not present

## 2019-07-07 DIAGNOSIS — M10061 Idiopathic gout, right knee: Secondary | ICD-10-CM | POA: Diagnosis not present

## 2019-07-07 DIAGNOSIS — M25461 Effusion, right knee: Secondary | ICD-10-CM | POA: Diagnosis present

## 2019-07-07 DIAGNOSIS — M109 Gout, unspecified: Secondary | ICD-10-CM

## 2019-07-07 DIAGNOSIS — F17210 Nicotine dependence, cigarettes, uncomplicated: Secondary | ICD-10-CM | POA: Insufficient documentation

## 2019-07-07 NOTE — ED Triage Notes (Signed)
Pt seen here 3 weeks ago for same swelling in right knee that had fluid drawn off at that time.  Pt states he has been trying to get into the [place], but has been unable to follow up

## 2019-07-08 LAB — BASIC METABOLIC PANEL
Anion gap: 5 (ref 5–15)
BUN: 32 mg/dL — ABNORMAL HIGH (ref 6–20)
CO2: 21 mmol/L — ABNORMAL LOW (ref 22–32)
Calcium: 8.1 mg/dL — ABNORMAL LOW (ref 8.9–10.3)
Chloride: 112 mmol/L — ABNORMAL HIGH (ref 98–111)
Creatinine, Ser: 2.16 mg/dL — ABNORMAL HIGH (ref 0.61–1.24)
GFR calc Af Amer: 39 mL/min — ABNORMAL LOW (ref >60)
GFR calc non Af Amer: 34 mL/min — ABNORMAL LOW (ref >60)
Glucose, Bld: 153 mg/dL — ABNORMAL HIGH (ref 70–99)
Potassium: 4 mmol/L (ref 3.5–5.1)
Sodium: 138 mmol/L (ref 135–145)

## 2019-07-08 LAB — I-STAT CHEM 8, ED
BUN: 28 mg/dL — ABNORMAL HIGH (ref 6–20)
Calcium, Ion: 1.12 mmol/L — ABNORMAL LOW (ref 1.15–1.40)
Chloride: 107 mmol/L (ref 98–111)
Creatinine, Ser: 2.3 mg/dL — ABNORMAL HIGH (ref 0.61–1.24)
Glucose, Bld: 206 mg/dL — ABNORMAL HIGH (ref 70–99)
HCT: 37 % — ABNORMAL LOW (ref 39.0–52.0)
Hemoglobin: 12.6 g/dL — ABNORMAL LOW (ref 13.0–17.0)
Potassium: 3.7 mmol/L (ref 3.5–5.1)
Sodium: 141 mmol/L (ref 135–145)
TCO2: 23 mmol/L (ref 22–32)

## 2019-07-08 LAB — SYNOVIAL CELL COUNT + DIFF, W/ CRYSTALS
Eosinophils-Synovial: 0 % (ref 0–1)
Lymphocytes-Synovial Fld: 9 % (ref 0–20)
Monocyte-Macrophage-Synovial Fluid: 4 % — ABNORMAL LOW (ref 50–90)
Neutrophil, Synovial: 87 % — ABNORMAL HIGH (ref 0–25)
WBC, Synovial: 6975 /mm3 — ABNORMAL HIGH (ref 0–200)

## 2019-07-08 MED ORDER — PREDNISONE 20 MG PO TABS: ORAL_TABLET | ORAL | 0 refills | Status: DC

## 2019-07-08 MED ORDER — LIDOCAINE-EPINEPHRINE (PF) 2 %-1:200000 IJ SOLN
20.0000 mL | Freq: Once | INTRAMUSCULAR | Status: AC
  Administered 2019-07-08: 20 mL via INTRADERMAL

## 2019-07-08 MED ORDER — COLCHICINE 0.6 MG PO TABS
0.6000 mg | ORAL_TABLET | Freq: Once | ORAL | Status: AC
  Administered 2019-07-08: 0.6 mg via ORAL
  Filled 2019-07-08: qty 1

## 2019-07-08 MED ORDER — KETOROLAC TROMETHAMINE 30 MG/ML IJ SOLN
30.0000 mg | Freq: Once | INTRAMUSCULAR | Status: AC
  Administered 2019-07-08: 30 mg via INTRAMUSCULAR
  Filled 2019-07-08: qty 1

## 2019-07-08 MED ORDER — LIDOCAINE-EPINEPHRINE (PF) 2 %-1:200000 IJ SOLN
INTRAMUSCULAR | Status: AC
  Filled 2019-07-08: qty 20

## 2019-07-08 NOTE — ED Provider Notes (Signed)
[facility] EMERGENCY DEPARTMENT Provider Note   CSN: [id number] Arrival date & time: 07/07/19  2331  Time seen 12:20 AM  History   Chief Complaint Chief Complaint  Patient presents with  . Joint Swelling    HPI Kristopher Hogan is a 53 y.o. male.     HPI patient told me his right knee started swelling July 29.  He states he normally goes to the [facility] to get cortisone shots in his right knee on a regular basis but because the COVID the last when he got was in March.  He was seen here on July 18 and had an aspiration done which showed gouty crystals but no bacteria.  He states since that visit the [place] has called him in some allopurinol which she is taken for "a few weeks".  He denies any fever.  He states his knee has swollen like this several times before especially since March when he has been unable to get his cortisone shots.  [facility], [place]   Past Medical History:  Diagnosis Date  . Arthritis   . Diabetes mellitus   . Gout   . Hypertension     There are no active problems to display for this patient.   Past Surgical History:  Procedure Laterality Date  . KNEE ARTHROSCOPY          Home Medications    Prior to Admission medications   Medication Sig Start Date End Date Taking? Authorizing Provider  atenolol (TENORMIN) 25 MG tablet Take 25 mg by mouth daily.    [provider]  indomethacin (INDOCIN) 50 MG capsule Take 1 capsule (50 mg total) by mouth 2 (two) times daily with a meal. Until pain tolerable. 03/26/17   Triplett, Tammy, PA-C  lisinopril (PRINIVIL,ZESTRIL) 5 MG tablet Take 5 mg by mouth daily.    [provider]  naproxen (NAPROSYN) 500 MG tablet Take 1 tablet (500 mg total) by mouth 2 (two) times daily. 06/24/19   Horton, Barbette Hair, MD  oxyCODONE-acetaminophen (PERCOCET/ROXICET) 5-325 MG tablet Take 1 tablet by mouth every 4 (four) hours as needed. 03/26/17   Triplett, Tammy, PA-C  predniSONE (DELTASONE) 20 MG  tablet Take 2 po QD x4d then 1 po QD x 4d 07/08/19   Rolland Porter, MD    Family History No family history on file.  Social History Social History   Tobacco Use  . Smoking status: Current Some Day Smoker    Packs/day: 1.00    Types: Cigars  . Smokeless tobacco: Never Used  Substance Use Topics  . Alcohol use: No  . Drug use: No     Allergies   Penicillins   Review of Systems Review of Systems  All other systems reviewed and are negative.    Physical Exam Updated Vital Signs BP (!) 160/78 (BP Location: Right Arm)   Pulse 73   Temp 98.6 F (37 C) (Oral)   Resp 20   Ht 6' (1.829 m)   Wt 93.4 kg   SpO2 100%   BMI 27.94 kg/m   Physical Exam Vitals signs and nursing note reviewed.  Constitutional:      Appearance: Normal appearance. He is normal weight.  HENT:     Head: Normocephalic and atraumatic.     Right Ear: External ear normal.     Left Ear: External ear normal.     Nose: Nose normal.  Eyes:     Extraocular Movements: Extraocular movements intact.  Conjunctiva/sclera: Conjunctivae normal.  Neck:     Musculoskeletal: Normal range of motion.  Cardiovascular:     Rate and Rhythm: Normal rate.  Pulmonary:     Effort: Pulmonary effort is normal. No respiratory distress.  Musculoskeletal:        General: Swelling present.     Comments: Patient has a moderate effusion of his right knee.  He has a scar over the anterior knee which he states is from some tendon repair that has had surgery 3 times and states it cannot be totally fixed.  Skin:    General: Skin is warm and dry.     Capillary Refill: Capillary refill takes less than 2 seconds.     Findings: No erythema or rash.  Neurological:     General: No focal deficit present.     Mental Status: He is alert and oriented to person, place, and time.     Cranial Nerves: No cranial nerve deficit.  Psychiatric:        Mood and Affect: Mood normal.        Behavior: Behavior normal.        Thought Content:  Thought content normal.      ED Treatments / Results  Labs (all labs ordered are listed, but only abnormal results are displayed)   Results for orders placed or performed during the hospital encounter of 07/07/19  Synovial cell count + diff, w/ crystals  Result Value Ref Range   Color, Synovial YELLOW YELLOW   Appearance-Synovial CLOUDY (A) CLEAR   Crystals, Fluid INTRACELLULAR MONOSODIUM URATE CRYSTALS    WBC, Synovial 6,975 (H) 0 - 200 /cu mm   Neutrophil, Synovial 87 (H) 0 - 25 %   Lymphocytes-Synovial Fld 9 0 - 20 %   Monocyte-Macrophage-Synovial Fluid 4 (L) 50 - 90 %   Eosinophils-Synovial 0 0 - 1 %  Basic metabolic panel  Result Value Ref Range   Sodium 138 135 - 145 mmol/L   Potassium 4.0 3.5 - 5.1 mmol/L   Chloride 112 (H) 98 - 111 mmol/L   CO2 21 (L) 22 - 32 mmol/L   Glucose, Bld 153 (H) 70 - 99 mg/dL   BUN 32 (H) 6 - 20 mg/dL   Creatinine, Ser 2.16 (H) 0.61 - 1.24 mg/dL   Calcium 8.1 (L) 8.9 - 10.3 mg/dL   GFR calc non Af Amer 34 (L) >60 mL/min   GFR calc Af Amer 39 (L) >60 mL/min   Anion gap 5 5 - 15  I-stat chem 8, ED (not at [facility] or [facility])  Result Value Ref Range   Sodium 141 135 - 145 mmol/L   Potassium 3.7 3.5 - 5.1 mmol/L   Chloride 107 98 - 111 mmol/L   BUN 28 (H) 6 - 20 mg/dL   Creatinine, Ser 2.30 (H) 0.61 - 1.24 mg/dL   Glucose, Bld 206 (H) 70 - 99 mg/dL   Calcium, Ion 1.12 (L) 1.15 - 1.40 mmol/L   TCO2 23 22 - 32 mmol/L   Hemoglobin 12.6 (L) 13.0 - 17.0 g/dL   HCT 37.0 (L) 39.0 - 52.0 %    Laboratory interpretation all normal except crystals, cell count reassuring that he does not have a septic joint.  Patient did have a renal insufficiency on his i-STAT 8, he was normal in 2012.  Be met was done.     Results for orders placed or performed during the hospital encounter of 06/23/19  Gram stain   Specimen: KNEE; Body Fluid  Result Value Ref Range   Specimen Description KNEE    Special Requests NONE    Gram Stain      CYTOSPIN SMEAR NO  ORGANISMS SEEN WBC PRESENT, PREDOMINANTLY PMN Performed at [facility], [address] [place], [place] [address]    Report Status 06/24/2019 FINAL   Culture, body fluid-bottle   Specimen: Synovium  Result Value Ref Range   Specimen Description SYNOVIAL    Special Requests BOTTLES DRAWN AEROBIC AND ANAEROBIC 10CC EACH    Culture      NO GROWTH 5 DAYS Performed at [facility], [address] [place],  [address]    Report Status 06/29/2019 FINAL   Cell count + diff,  w/ cryst-synvl fld  Result Value Ref Range   Color, Synovial YELLOW (A) YELLOW   Appearance-Synovial HAZY (A) CLEAR   Crystals, Fluid INTRACELLULAR MONOSODIUM URATE CRYSTALS    WBC, Synovial 1,150 (H) 0 - 200 /cu mm   Neutrophil, Synovial 79 (H) 0 - 25 %   Lymphocytes-Synovial Fld 0 0 - 20 %   Monocyte-Macrophage-Synovial Fluid 21 (L) 50 - 90 %   Eosinophils-Synovial 0 0 - 1 %   Other Cells-SYN 0        EKG None  Radiology No results found.   Dg Knee Complete 4 Views Right  Result Date: 06/23/2019 CLINICAL DATA:  Right knee pain and swelling.  IMPRESSION: 1. Marked anterior soft tissue edema/soft tissue thickening, unchanged from prior exams. 2. 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 Osteoarthritis. No acute osseous abnormality. Electronically Signed   By: Donal Rake M.D.   On: 06/23/2019 23:14   Procedures .Joint Aspiration/Arthrocentesis  Date/Time: 07/08/2019 1:19 AM Performed by: Rolland Porter, MD Authorized by: Rolland Porter, MD   Consent:    Consent obtained:  Verbal and written   Consent given by:  Patient   Risks discussed:  Infection Location:    Location:  Knee   Knee:  R knee Anesthesia (see MAR for exact dosages):    Anesthesia method:  Local infiltration   Local anesthetic:  Lidocaine 2% WITH epi Procedure details:    Preparation: Patient was prepped and draped in usual sterile fashion     Needle gauge:  18 G   Ultrasound guidance: no     Approach:  Lateral   Aspirate characteristics:   Cloudy and yellow   Steroid injected: no     Specimen collected: yes   Comments:     Ace wrap  30 cc syringe full x 3 + 35 cc fluid removed   (including critical care time)  Medications Ordered in ED Medications  ketorolac (TORADOL) 30 MG/ML injection 30 mg (30 mg Intramuscular Given 07/08/19 0033)  colchicine tablet 0.6 mg (0.6 mg Oral Given 07/08/19 0033)  lidocaine-EPINEPHrine (XYLOCAINE W/EPI) 2 %-1:200000 (PF) injection 20 mL (20 mLs Intradermal Given 07/08/19 0108)     Initial Impression / Assessment and Plan / ED Course  I have reviewed the triage vital signs and the nursing notes.  Pertinent labs & imaging results that were available during my care of the patient were reviewed by me and considered in my medical decision making (see chart for details).       When I review patient's labs he has no blood work for several years.  I-STAT 8 was done to make sure his kidney function is normal.  He was given Toradol IM and colchicine orally.  Patient's kidney function appeared to be elevated on his i-STAT 8, his last one in  our system was 2012 and it was normal.   Bmet was done to check on his renal status because that will make a difference what medication he can be discharged home on.  Patient's cell count is consistent with acute gouty arthritis and is not suggestive of septic arthritis.  Bmet was done and verifies that he does have the renal insufficiency, patient's not sure if he has had that before.  He was started on allopurinol recently by his [place] doctors.  He was advised to contact them to let them know about his kidney function.  This will also restrict me with what I can give him for his acute gout.  We discussed that he would be sent home with steroids and while he is on the steroids he needs to be very careful about his diabetic diet and check his blood sugars frequently.  Final Clinical Impressions(s) / ED Diagnoses   Final diagnoses:  Gouty arthritis of right knee  Renal  insufficiency    ED Discharge Orders         Ordered    predniSONE (DELTASONE) 20 MG tablet     07/08/19 0416          Plan discharge  Rolland Porter, MD, Barbette Or, MD 07/08/19 [id number]

## 2019-07-08 NOTE — ED Notes (Signed)
Pts knee wrapped with ace bandage

## 2019-07-08 NOTE — Discharge Instructions (Addendum)
Look at the gout and low purine diet.  Take the prednisone until gone.  While on the prednisone please monitor your blood sugar closely and try to follow the diabetic diet because it can make your blood sugar get elevated.  Please contact your doctors at the [facility] to let them know about your ED visit tonight.  Your kidney tests showed a BUN of 32 and creatinine of 2.16.  The last blood test I have on you are from 2012 and your kidney function was normal at that time.  If this is new it may be because of the new medication they added for the gout which may be the allopurinol.  They may want you to stop it if your kidney function was normal before you were started on the allopurinol.  However if your kidney function is at its baseline, they may want you to continue the allopurinol.  Recheck if you get a fever, increased redness or warmth to the skin around your knee or your pain gets worse.

## 2019-07-13 LAB — CULTURE, BODY FLUID W GRAM STAIN -BOTTLE: Culture: NO GROWTH

## 2021-07-27 ENCOUNTER — Emergency Department (HOSPITAL_COMMUNITY)
Admission: EM | Admit: 2021-07-27 | Discharge: 2021-07-27 | Disposition: A | Discharge status: Home / Self Care | Payer: No Typology Code available for payment source | Attending: Emergency Medicine | Admitting: Emergency Medicine

## 2021-07-27 ENCOUNTER — Encounter (HOSPITAL_COMMUNITY): Payer: Self-pay | Admitting: Emergency Medicine

## 2021-07-27 ENCOUNTER — Other Ambulatory Visit: Payer: Self-pay

## 2021-07-27 ENCOUNTER — Emergency Department (HOSPITAL_COMMUNITY): Payer: No Typology Code available for payment source

## 2021-07-27 DIAGNOSIS — I10 Essential (primary) hypertension: Secondary | ICD-10-CM | POA: Diagnosis not present

## 2021-07-27 DIAGNOSIS — R112 Nausea with vomiting, unspecified: Secondary | ICD-10-CM | POA: Diagnosis not present

## 2021-07-27 DIAGNOSIS — F17210 Nicotine dependence, cigarettes, uncomplicated: Secondary | ICD-10-CM | POA: Insufficient documentation

## 2021-07-27 DIAGNOSIS — N179 Acute kidney failure, unspecified: Secondary | ICD-10-CM

## 2021-07-27 DIAGNOSIS — Z79899 Other long term (current) drug therapy: Secondary | ICD-10-CM | POA: Diagnosis not present

## 2021-07-27 DIAGNOSIS — R1010 Upper abdominal pain, unspecified: Secondary | ICD-10-CM | POA: Insufficient documentation

## 2021-07-27 DIAGNOSIS — E119 Type 2 diabetes mellitus without complications: Secondary | ICD-10-CM | POA: Insufficient documentation

## 2021-07-27 DIAGNOSIS — K5900 Constipation, unspecified: Secondary | ICD-10-CM

## 2021-07-27 LAB — COMPREHENSIVE METABOLIC PANEL
ALT: 11 U/L (ref 0–44)
AST: 12 U/L — ABNORMAL LOW (ref 15–41)
Albumin: 3.8 g/dL (ref 3.5–5.0)
Alkaline Phosphatase: 89 U/L (ref 38–126)
Anion gap: 11 (ref 5–15)
BUN: 62 mg/dL — ABNORMAL HIGH (ref 6–20)
CO2: 27 mmol/L (ref 22–32)
Calcium: 8.4 mg/dL — ABNORMAL LOW (ref 8.9–10.3)
Chloride: 104 mmol/L (ref 98–111)
Creatinine, Ser: 3.72 mg/dL — ABNORMAL HIGH (ref 0.61–1.24)
GFR, Estimated: 18 mL/min — ABNORMAL LOW (ref >60)
Glucose, Bld: 124 mg/dL — ABNORMAL HIGH (ref 70–99)
Potassium: 3.3 mmol/L — ABNORMAL LOW (ref 3.5–5.1)
Sodium: 142 mmol/L (ref 135–145)
Total Bilirubin: 0.4 mg/dL (ref 0.3–1.2)
Total Protein: 7.1 g/dL (ref 6.5–8.1)

## 2021-07-27 LAB — CBC
HCT: 36.4 % — ABNORMAL LOW (ref 39.0–52.0)
Hemoglobin: 12.6 g/dL — ABNORMAL LOW (ref 13.0–17.0)
MCH: 30.1 pg (ref 26.0–34.0)
MCHC: 34.6 g/dL (ref 30.0–36.0)
MCV: 86.9 fL (ref 80.0–100.0)
Platelets: 214 10*3/uL (ref 150–400)
RBC: 4.19 MIL/uL — ABNORMAL LOW (ref 4.22–5.81)
RDW: 17.2 % — ABNORMAL HIGH (ref 11.5–15.5)
WBC: 7 10*3/uL (ref 4.0–10.5)
nRBC: 0 % (ref 0.0–0.2)

## 2021-07-27 LAB — CBG MONITORING, ED: Glucose-Capillary: 103 mg/dL — ABNORMAL HIGH (ref 70–99)

## 2021-07-27 LAB — LIPASE, BLOOD: Lipase: 66 U/L — ABNORMAL HIGH (ref 11–51)

## 2021-07-27 MED ORDER — LACTATED RINGERS IV BOLUS
1000.0000 mL | Freq: Once | INTRAVENOUS | Status: AC
  Administered 2021-07-27: 1000 mL via INTRAVENOUS

## 2021-07-27 NOTE — ED Notes (Signed)
ED Provider at bedside. 

## 2021-07-27 NOTE — ED Provider Notes (Signed)
  Physical Exam  BP (!) 160/79   Pulse 62   Temp 98.3 F (36.8 C) (Oral)   Resp 16   Ht 6' (1.829 m)   Wt 81.6 kg   SpO2 100%   BMI 24.41 kg/m   Physical Exam Constitutional:      General: He is not in acute distress.    Appearance: Normal appearance.  HENT:     Head: Normocephalic and atraumatic.     Nose: No congestion or rhinorrhea.  Eyes:     General:        Right eye: No discharge.        Left eye: No discharge.     Extraocular Movements: Extraocular movements intact.     Pupils: Pupils are equal, round, and reactive to light.  Cardiovascular:     Rate and Rhythm: Normal rate and regular rhythm.     Heart sounds: No murmur heard. Pulmonary:     Effort: No respiratory distress.     Breath sounds: No wheezing or rales.  Abdominal:     General: There is no distension.     Tenderness: There is no abdominal tenderness.  Musculoskeletal:        General: Normal range of motion.     Cervical back: Normal range of motion.  Skin:    General: Skin is warm and dry.  Neurological:     General: No focal deficit present.     Mental Status: He is alert.    ED Course/Procedures   Clinical Course as of 07/27/21 1536  Sun Jul 27, 2021  4235 Pending labs, if lipase and CMP ok will be dc [MK]    Clinical Course User Index [MK] Alberto Schoch, Wyn Forster, MD    Procedures  MDM  Patient received in signout from previous provider.  Laboratory evaluation reveals a significant creatinine elevation to 3.72 evaded from 2.16.  Lipase elevated to 66.  Patient was given fluid resuscitation with lactated Ringer's and on reevaluation, patient symptoms have completely resolved.  I had a long discussion with the patient about admission to the hospital for acute kidney injury [facility].  Using shared decision-making, the patient requested to be discharged today with [facility] as he feels his symptoms have completely resolved.  Patient was then discharged with  [facility].       Glendora Score, MD 07/27/21 1538

## 2021-07-27 NOTE — ED Triage Notes (Signed)
Pt here for c/o Upper abdominal pain and vomiting (once yesterday morning and once this morning). Pt states he has been "locked up" for a week and was not given his Glipizide so he wants his "sugar checked out". Pt states his machine at home is "dead".

## 2021-07-27 NOTE — Discharge Instructions (Addendum)
You were seen today for some abdominal discomfort.  Your x-ray indicates constipation.  Make sure you are drinking plenty of water.  Your labs also indicate an elevated creatinine which is indicative of decreased kidney function.  We had a long conversation about how to address this, and you have decided to be discharged at this time to follow-up in the [place] clinic on Monday.  You received fluids for this and I need you to drink lots of water between now and when you follow-up.  If you have any nausea, vomiting, palpitations, chest pain or shortness of breath, please return to emergency department immediately.

## 2021-07-27 NOTE — ED Provider Notes (Signed)
[facility] EMERGENCY DEPARTMENT Provider Note   CSN: [id number] Arrival date & time: 07/27/21  3491     History Chief Complaint  Patient presents with   Abdominal Pain    Kristopher Hogan is a 55 y.o. male.  HPI     This is a 55 year old male who has history of arthritis, diabetes, hypertension who presents with abdominal discomfort.  Patient reports that he was incarcerated last week.  While he was in jail, he did not have access to his regular blood pressure and diabetes medication.  He is concerned that this is causing his symptoms.  He reports upper abdominal discomfort and occasional nausea.  He has had 2 episodes of nonbilious, nonbloody emesis some, mostly in the morning.  He states he has not had a normal bowel movement since getting out of jail.  He states he did post bail on Friday and took his glipizide normally as he was yesterday.  He is not had any recent fevers or sick contacts.  Currently he does not have any abdominal pain.  Denies alcohol or drug use.  Blood sugar monitor at the bedside.  Past Medical History:  Diagnosis Date   Arthritis    Diabetes mellitus    Gout    Hypertension     There are no problems to display for this patient.   Past Surgical History:  Procedure Laterality Date   KNEE ARTHROSCOPY         History reviewed. No pertinent family history.  Social History   Tobacco Use   Smoking status: Some Days    Packs/day: 1.00    Types: Cigars, Cigarettes   Smokeless tobacco: Never  Substance Use Topics   Alcohol use: No   Drug use: No    Home Medications Prior to Admission medications   Medication Sig Start Date End Date Taking? Authorizing Provider  atenolol (TENORMIN) 25 MG tablet Take 25 mg by mouth daily.    [provider]  indomethacin (INDOCIN) 50 MG capsule Take 1 capsule (50 mg total) by mouth 2 (two) times daily with a meal. Until pain tolerable. 03/26/17   Triplett, Tammy, PA-C  lisinopril (PRINIVIL,ZESTRIL) 5  MG tablet Take 5 mg by mouth daily.    [provider]  naproxen (NAPROSYN) 500 MG tablet Take 1 tablet (500 mg total) by mouth 2 (two) times daily. 06/24/19   Jewell Haught, [name], MD  oxyCODONE-acetaminophen (PERCOCET/ROXICET) 5-325 MG tablet Take 1 tablet by mouth every 4 (four) hours as needed. 03/26/17   Triplett, Tammy, PA-C  predniSONE (DELTASONE) 20 MG tablet Take 2 po QD x4d then 1 po QD x 4d 07/08/19   Devoria Albe, MD    Allergies    Penicillins  Review of Systems   Review of Systems  Constitutional:  Negative for fever.  Respiratory:  Negative for shortness of breath.   Cardiovascular:  Negative for chest pain.  Gastrointestinal:  Positive for abdominal pain, nausea and vomiting. Negative for constipation.  Genitourinary:  Negative for dysuria.  All other systems reviewed and are negative.  Physical Exam Updated Vital Signs BP (!) 149/89   Pulse 65   Temp 98.3 F (36.8 C) (Oral)   Resp 16   Ht 1.829 m (6')   Wt 81.6 kg   SpO2 100%   BMI 24.41 kg/m   Physical Exam Vitals and nursing note reviewed.  Constitutional:      Appearance: He is well-developed. He is not ill-appearing.  HENT:  Head: Normocephalic and atraumatic.     Mouth/Throat:     Mouth: Mucous membranes are moist.  Eyes:     Pupils: Pupils are equal, round, and reactive to light.  Cardiovascular:     Rate and Rhythm: Normal rate and regular rhythm.     Heart sounds: Normal heart sounds. No murmur heard. Pulmonary:     Effort: Pulmonary effort is normal. No respiratory distress.     Breath sounds: Normal breath sounds. No wheezing.  Abdominal:     General: Bowel sounds are normal.     Palpations: Abdomen is soft.     Tenderness: There is no abdominal tenderness. There is no guarding or rebound.  Musculoskeletal:     Cervical back: Neck supple.  Lymphadenopathy:     Cervical: No cervical adenopathy.  Skin:    General: Skin is warm and dry.  Neurological:     Mental Status: He is alert  and oriented to person, place, and time.  Psychiatric:        Mood and Affect: Mood normal.    ED Results / Procedures / Treatments   Labs (all labs ordered are listed, but only abnormal results are displayed) Labs Reviewed  CBG MONITORING, ED - Abnormal; Notable for the following components:      Result Value   Glucose-Capillary 103 (*)    All other components within normal limits  LIPASE, BLOOD  COMPREHENSIVE METABOLIC PANEL  CBC  URINALYSIS, ROUTINE W REFLEX MICROSCOPIC    EKG None  Radiology No results found.  Procedures Procedures   Medications Ordered in ED Medications - No data to display  ED Course  I have reviewed the triage vital signs and the nursing notes.  Pertinent labs & imaging results that were available during my care of the patient were reviewed by me and considered in my medical decision making (see chart for details).  Clinical Course as of 07/29/21 1833  Wynelle Link Jul 27, 2021  5825 Pending labs, if lipase and CMP ok will be dc [MK]    Clinical Course User Index [MK] Kommor, Wyn Forster, MD   MDM Rules/Calculators/A&P                           Patient presents with abdominal discomfort.  Also reports he has not had bowel movement since getting out of jail.  He is overall nontoxic and vital signs are reassuring.  His abdominal exam is benign and he is not currently with any pain.  We will obtain some basic lab work as he states that he has been out of his medications.  If lab work is reassuring, do not feel imaging would need to be pursued as his abdominal exam is benign and have low suspicion for intra-abdominal pathology such as obstruction, appendicitis, cholecystitis.  Signed out to oncoming provider Final Clinical Impression(s) / ED Diagnoses Final diagnoses:  None    Rx / DC Orders ED Discharge Orders     None        Makayleigh Poliquin, [name], MD 07/29/21 [phone]

## 2023-03-12 IMAGING — DX DG ABDOMEN 1V
2 series · 2 of 2 positions shown · non-contrast
Comparison: CT 07/10/2008

CLINICAL DATA: Abdominal pain and clinical constipation.

EXAM:
ABDOMEN - 1 VIEW

[abdomen supine grid (1 of 2)]
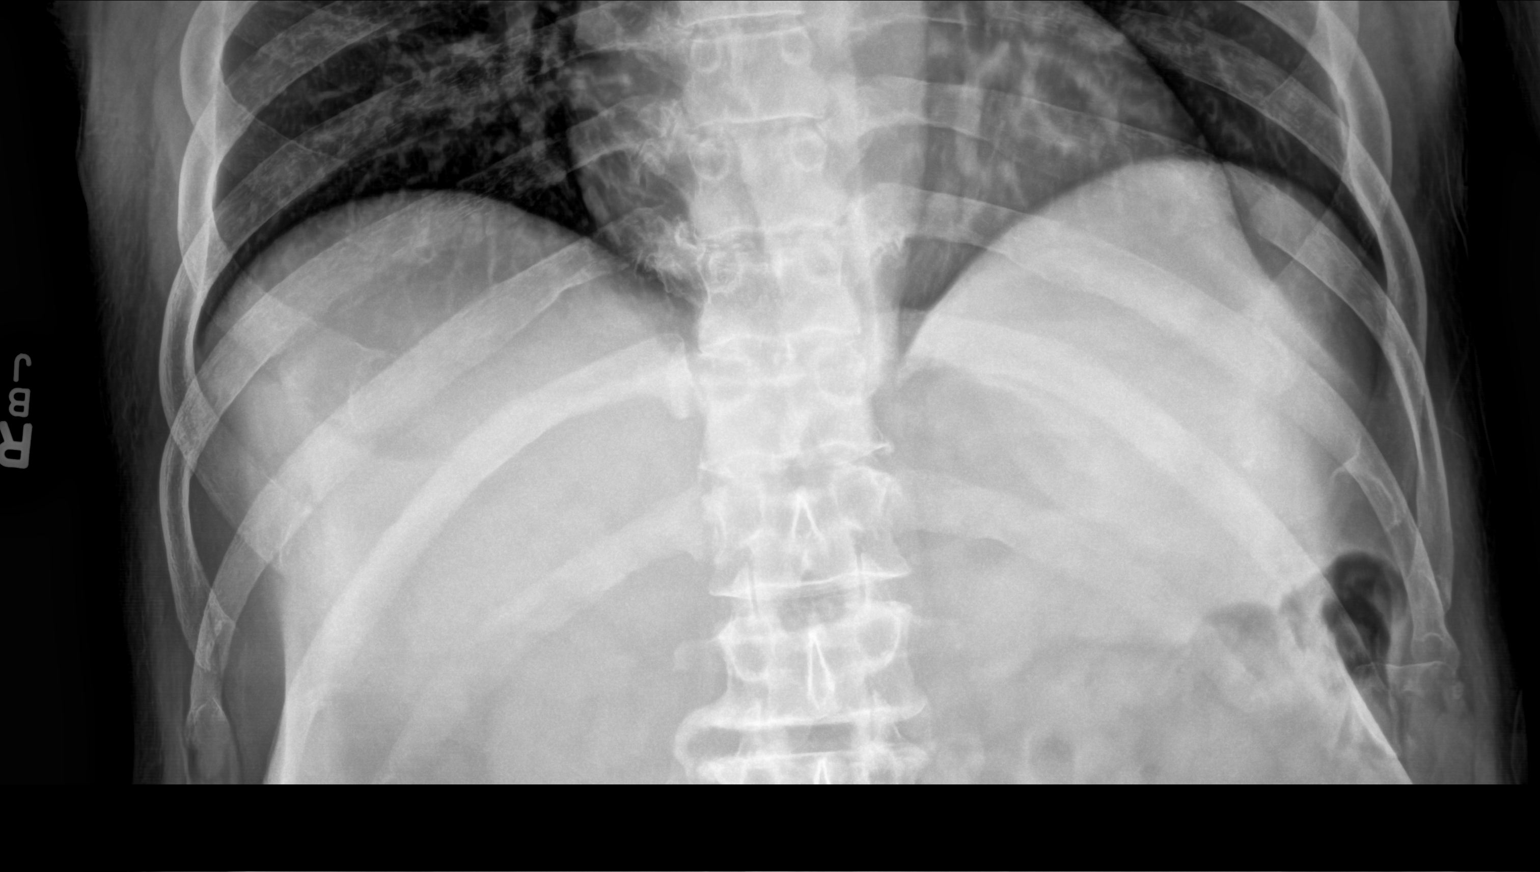

[abdomen supine grid (2 of 2)]
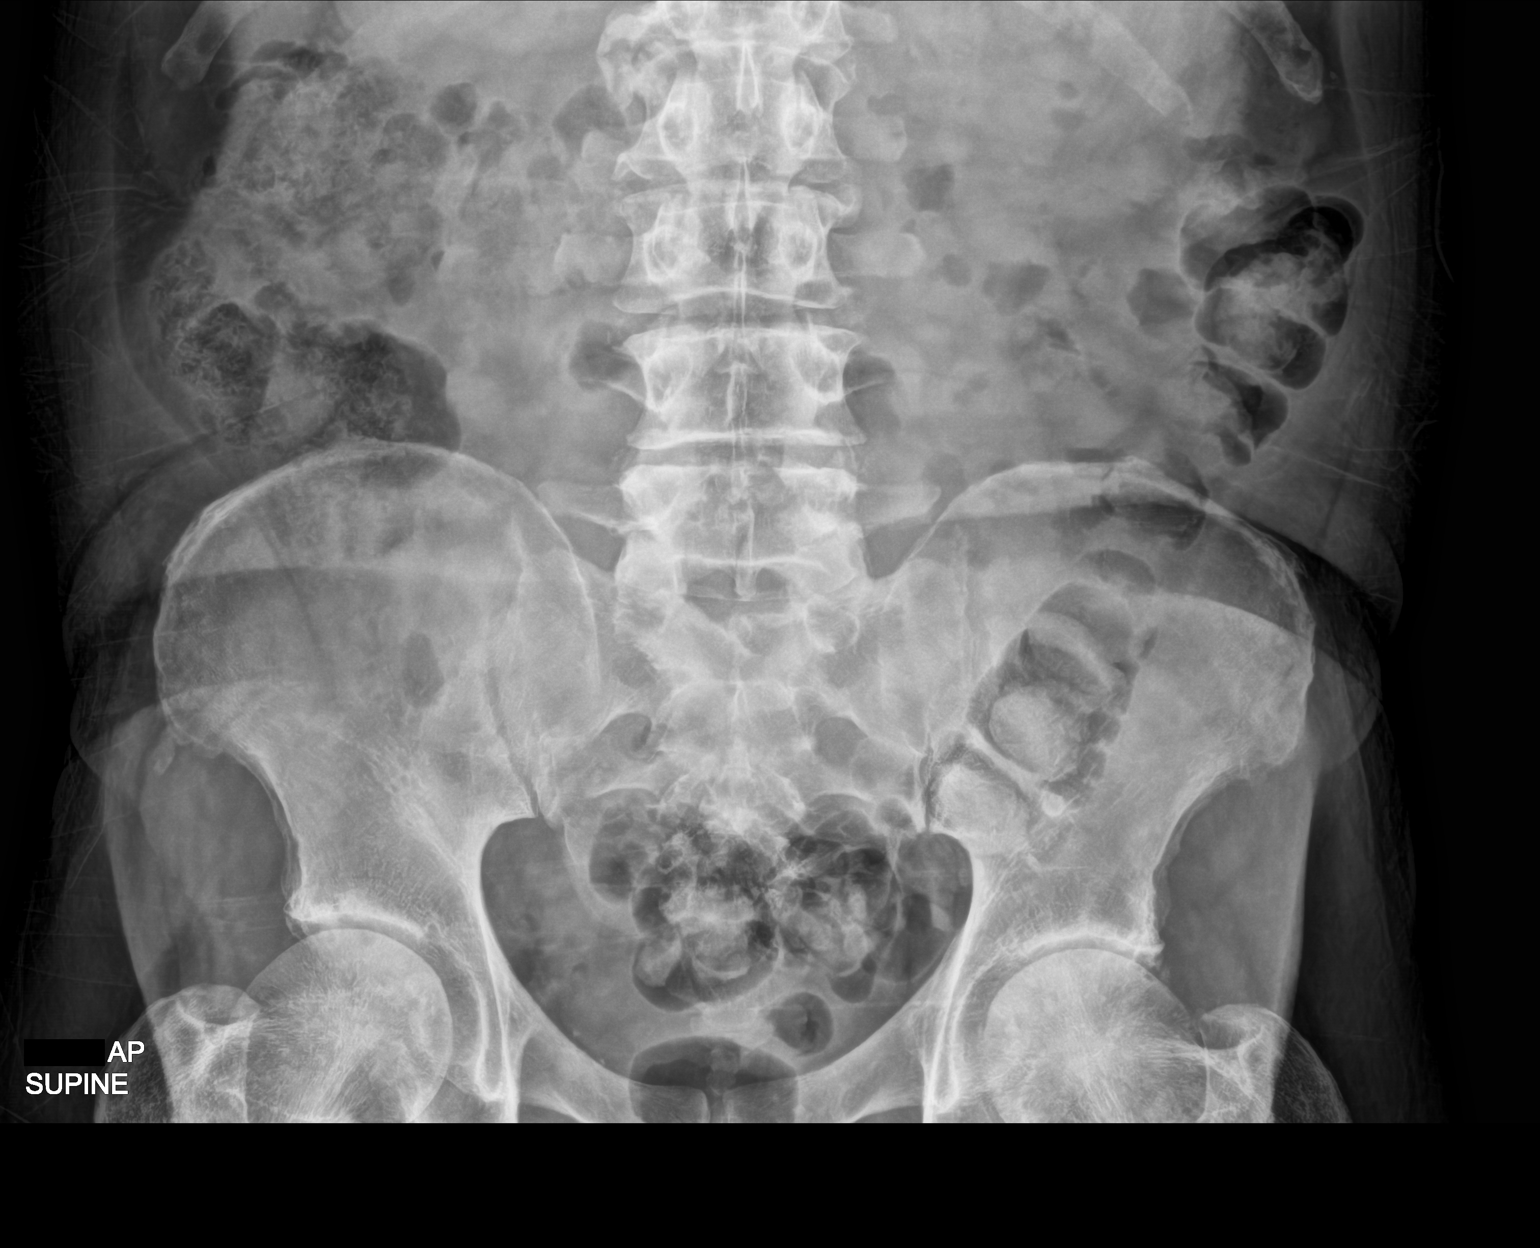

[2 of 2 positions shown; findings below may reference images not displayed]

FINDINGS: 2 supine views of the abdomen and pelvis. No free intraperitoneal
air. No evidence of bowel obstruction. Large amount of ascending
colonic stool. No abnormal abdominal calcifications. No
appendicolith. Degenerative changes of both hips.
IMPRESSION: 1. No acute findings.
2. Possible constipation.

## 2023-04-08 HISTORY — PX: PACEMAKER INSERTION: SHX728

## 2023-04-15 ENCOUNTER — Inpatient Hospital Stay (HOSPITAL_COMMUNITY)
Admission: EM | Admit: 2023-04-15 | Discharge: 2023-04-17 | DRG: 638 | Disposition: A | Discharge status: Home / Self Care | Payer: No Typology Code available for payment source | Attending: Internal Medicine | Admitting: Internal Medicine

## 2023-04-15 ENCOUNTER — Emergency Department (HOSPITAL_COMMUNITY): Payer: No Typology Code available for payment source

## 2023-04-15 ENCOUNTER — Observation Stay (HOSPITAL_COMMUNITY): Payer: No Typology Code available for payment source

## 2023-04-15 ENCOUNTER — Other Ambulatory Visit: Payer: Self-pay

## 2023-04-15 ENCOUNTER — Encounter (HOSPITAL_COMMUNITY): Payer: Self-pay

## 2023-04-15 DIAGNOSIS — R55 Syncope and collapse: Secondary | ICD-10-CM | POA: Diagnosis present

## 2023-04-15 DIAGNOSIS — E872 Acidosis, unspecified: Secondary | ICD-10-CM | POA: Diagnosis not present

## 2023-04-15 DIAGNOSIS — N184 Chronic kidney disease, stage 4 (severe): Secondary | ICD-10-CM | POA: Diagnosis not present

## 2023-04-15 DIAGNOSIS — I471 Supraventricular tachycardia, unspecified: Secondary | ICD-10-CM | POA: Diagnosis present

## 2023-04-15 DIAGNOSIS — E11649 Type 2 diabetes mellitus with hypoglycemia without coma: Principal | ICD-10-CM | POA: Diagnosis present

## 2023-04-15 DIAGNOSIS — I10 Essential (primary) hypertension: Secondary | ICD-10-CM | POA: Diagnosis present

## 2023-04-15 DIAGNOSIS — R2990 Unspecified symptoms and signs involving the nervous system: Secondary | ICD-10-CM | POA: Diagnosis present

## 2023-04-15 DIAGNOSIS — F17290 Nicotine dependence, other tobacco product, uncomplicated: Secondary | ICD-10-CM | POA: Diagnosis present

## 2023-04-15 DIAGNOSIS — E785 Hyperlipidemia, unspecified: Secondary | ICD-10-CM | POA: Diagnosis present

## 2023-04-15 DIAGNOSIS — I493 Ventricular premature depolarization: Secondary | ICD-10-CM | POA: Diagnosis present

## 2023-04-15 DIAGNOSIS — F17210 Nicotine dependence, cigarettes, uncomplicated: Secondary | ICD-10-CM | POA: Diagnosis present

## 2023-04-15 DIAGNOSIS — R2689 Other abnormalities of gait and mobility: Secondary | ICD-10-CM | POA: Diagnosis present

## 2023-04-15 DIAGNOSIS — W19XXXA Unspecified fall, initial encounter: Secondary | ICD-10-CM | POA: Diagnosis present

## 2023-04-15 DIAGNOSIS — I130 Hypertensive heart and chronic kidney disease with heart failure and stage 1 through stage 4 chronic kidney disease, or unspecified chronic kidney disease: Secondary | ICD-10-CM | POA: Diagnosis present

## 2023-04-15 DIAGNOSIS — R29700 NIHSS score 0: Secondary | ICD-10-CM | POA: Diagnosis present

## 2023-04-15 DIAGNOSIS — E1122 Type 2 diabetes mellitus with diabetic chronic kidney disease: Secondary | ICD-10-CM | POA: Diagnosis present

## 2023-04-15 DIAGNOSIS — N179 Acute kidney failure, unspecified: Secondary | ICD-10-CM | POA: Diagnosis present

## 2023-04-15 DIAGNOSIS — Z91148 Patient's other noncompliance with medication regimen for other reason: Secondary | ICD-10-CM

## 2023-04-15 DIAGNOSIS — R001 Bradycardia, unspecified: Secondary | ICD-10-CM | POA: Diagnosis present

## 2023-04-15 DIAGNOSIS — M1A9XX0 Chronic gout, unspecified, without tophus (tophi): Secondary | ICD-10-CM

## 2023-04-15 DIAGNOSIS — Z7984 Long term (current) use of oral hypoglycemic drugs: Secondary | ICD-10-CM

## 2023-04-15 DIAGNOSIS — Y92019 Unspecified place in single-family (private) house as the place of occurrence of the external cause: Secondary | ICD-10-CM

## 2023-04-15 DIAGNOSIS — G459 Transient cerebral ischemic attack, unspecified: Principal | ICD-10-CM | POA: Diagnosis present

## 2023-04-15 DIAGNOSIS — Z880 Allergy status to penicillin: Secondary | ICD-10-CM

## 2023-04-15 DIAGNOSIS — M1990 Unspecified osteoarthritis, unspecified site: Secondary | ICD-10-CM | POA: Diagnosis present

## 2023-04-15 DIAGNOSIS — M109 Gout, unspecified: Secondary | ICD-10-CM | POA: Diagnosis present

## 2023-04-15 DIAGNOSIS — E162 Hypoglycemia, unspecified: Secondary | ICD-10-CM

## 2023-04-15 DIAGNOSIS — G8194 Hemiplegia, unspecified affecting left nondominant side: Secondary | ICD-10-CM | POA: Diagnosis present

## 2023-04-15 DIAGNOSIS — E8722 Chronic metabolic acidosis: Secondary | ICD-10-CM | POA: Diagnosis present

## 2023-04-15 DIAGNOSIS — Z79899 Other long term (current) drug therapy: Secondary | ICD-10-CM

## 2023-04-15 DIAGNOSIS — Z950 Presence of cardiac pacemaker: Secondary | ICD-10-CM

## 2023-04-15 DIAGNOSIS — R29810 Facial weakness: Secondary | ICD-10-CM | POA: Diagnosis present

## 2023-04-15 DIAGNOSIS — R4701 Aphasia: Secondary | ICD-10-CM | POA: Diagnosis present

## 2023-04-15 DIAGNOSIS — K219 Gastro-esophageal reflux disease without esophagitis: Secondary | ICD-10-CM | POA: Diagnosis present

## 2023-04-15 DIAGNOSIS — I480 Paroxysmal atrial fibrillation: Secondary | ICD-10-CM | POA: Diagnosis present

## 2023-04-15 DIAGNOSIS — R32 Unspecified urinary incontinence: Secondary | ICD-10-CM | POA: Diagnosis present

## 2023-04-15 DIAGNOSIS — I5022 Chronic systolic (congestive) heart failure: Secondary | ICD-10-CM | POA: Diagnosis present

## 2023-04-15 DIAGNOSIS — Z823 Family history of stroke: Secondary | ICD-10-CM

## 2023-04-15 DIAGNOSIS — E1121 Type 2 diabetes mellitus with diabetic nephropathy: Secondary | ICD-10-CM | POA: Insufficient documentation

## 2023-04-15 DIAGNOSIS — F1021 Alcohol dependence, in remission: Secondary | ICD-10-CM | POA: Diagnosis present

## 2023-04-15 HISTORY — DX: Disorder of kidney and ureter, unspecified: N28.9

## 2023-04-15 LAB — URINALYSIS, ROUTINE W REFLEX MICROSCOPIC
Bilirubin Urine: NEGATIVE
Glucose, UA: 150 mg/dL — AB
Hgb urine dipstick: NEGATIVE
Ketones, ur: NEGATIVE mg/dL
Leukocytes,Ua: NEGATIVE
Nitrite: NEGATIVE
Protein, ur: 100 mg/dL — AB
Specific Gravity, Urine: 1.009 (ref 1.005–1.030)
pH: 6 (ref 5.0–8.0)

## 2023-04-15 LAB — CBG MONITORING, ED
Glucose-Capillary: 151 mg/dL — ABNORMAL HIGH (ref 70–99)
Glucose-Capillary: 52 mg/dL — ABNORMAL LOW (ref 70–99)
Glucose-Capillary: 105 mg/dL — ABNORMAL HIGH (ref 70–99)
Glucose-Capillary: 59 mg/dL — ABNORMAL LOW (ref 70–99)
Glucose-Capillary: 61 mg/dL — ABNORMAL LOW (ref 70–99)

## 2023-04-15 LAB — GLUCOSE, CAPILLARY
Glucose-Capillary: 143 mg/dL — ABNORMAL HIGH (ref 70–99)
Glucose-Capillary: 98 mg/dL (ref 70–99)

## 2023-04-15 LAB — CBC
HCT: 32.3 % — ABNORMAL LOW (ref 39.0–52.0)
Hemoglobin: 10.9 g/dL — ABNORMAL LOW (ref 13.0–17.0)
MCH: 29.2 pg (ref 26.0–34.0)
MCHC: 33.7 g/dL (ref 30.0–36.0)
MCV: 86.6 fL (ref 80.0–100.0)
Platelets: 219 10*3/uL (ref 150–400)
RBC: 3.73 MIL/uL — ABNORMAL LOW (ref 4.22–5.81)
RDW: 18.4 % — ABNORMAL HIGH (ref 11.5–15.5)
WBC: 6.6 10*3/uL (ref 4.0–10.5)
nRBC: 0 % (ref 0.0–0.2)

## 2023-04-15 LAB — COMPREHENSIVE METABOLIC PANEL
ALT: 17 U/L (ref 0–44)
AST: 17 U/L (ref 15–41)
Albumin: 3.7 g/dL (ref 3.5–5.0)
Alkaline Phosphatase: 115 U/L (ref 38–126)
Anion gap: 10 (ref 5–15)
BUN: 69 mg/dL — ABNORMAL HIGH (ref 6–20)
CO2: 16 mmol/L — ABNORMAL LOW (ref 22–32)
Calcium: 7.7 mg/dL — ABNORMAL LOW (ref 8.9–10.3)
Chloride: 110 mmol/L (ref 98–111)
Creatinine, Ser: 4.1 mg/dL — ABNORMAL HIGH (ref 0.61–1.24)
GFR, Estimated: 16 mL/min — ABNORMAL LOW (ref >60)
Glucose, Bld: 234 mg/dL — ABNORMAL HIGH (ref 70–99)
Potassium: 4.6 mmol/L (ref 3.5–5.1)
Sodium: 136 mmol/L (ref 135–145)
Total Bilirubin: 0.7 mg/dL (ref 0.3–1.2)
Total Protein: 7.6 g/dL (ref 6.5–8.1)

## 2023-04-15 LAB — DIFFERENTIAL
Abs Immature Granulocytes: 0.02 10*3/uL (ref 0.00–0.07)
Basophils Absolute: 0.1 10*3/uL (ref 0.0–0.1)
Basophils Relative: 1 %
Eosinophils Absolute: 0.4 10*3/uL (ref 0.0–0.5)
Eosinophils Relative: 7 %
Immature Granulocytes: 0 %
Lymphocytes Relative: 15 %
Lymphs Abs: 1 10*3/uL (ref 0.7–4.0)
Monocytes Absolute: 0.4 10*3/uL (ref 0.1–1.0)
Monocytes Relative: 6 %
Neutro Abs: 4.6 10*3/uL (ref 1.7–7.7)
Neutrophils Relative %: 71 %

## 2023-04-15 LAB — VITAMIN B12: Vitamin B-12: 513 pg/mL (ref 180–914)

## 2023-04-15 LAB — HEMOGLOBIN A1C
Hgb A1c MFr Bld: 4.5 % — ABNORMAL LOW (ref 4.8–5.6)
Mean Plasma Glucose: 82.45 mg/dL

## 2023-04-15 LAB — PROTIME-INR
INR: 1 (ref 0.8–1.2)
Prothrombin Time: 13 seconds (ref 11.4–15.2)

## 2023-04-15 LAB — RAPID URINE DRUG SCREEN, HOSP PERFORMED
Amphetamines: NOT DETECTED
Barbiturates: NOT DETECTED
Benzodiazepines: NOT DETECTED
Cocaine: NOT DETECTED
Opiates: NOT DETECTED
Tetrahydrocannabinol: NOT DETECTED

## 2023-04-15 LAB — TSH: TSH: 1.585 u[IU]/mL (ref 0.350–4.500)

## 2023-04-15 LAB — ETHANOL: Alcohol, Ethyl (B): <10 mg/dL (ref <10)

## 2023-04-15 LAB — APTT: aPTT: 34 seconds (ref 24–36)

## 2023-04-15 LAB — HIV ANTIBODY (ROUTINE TESTING W REFLEX): HIV Screen 4th Generation wRfx: NONREACTIVE

## 2023-04-15 MED ORDER — SODIUM BICARBONATE 650 MG PO TABS
650.0000 mg | ORAL_TABLET | Freq: Three times a day (TID) | ORAL | Status: DC
  Administered 2023-04-15 – 2023-04-17 (×7): 650 mg via ORAL
  Filled 2023-04-15 (×7): qty 1

## 2023-04-15 MED ORDER — CLOPIDOGREL BISULFATE 75 MG PO TABS
300.0000 mg | ORAL_TABLET | Freq: Once | ORAL | Status: AC
  Administered 2023-04-15: 300 mg via ORAL
  Filled 2023-04-15: qty 4

## 2023-04-15 MED ORDER — ACETAMINOPHEN 160 MG/5ML PO SOLN: 650.0000 mg | ORAL | Status: DC | PRN

## 2023-04-15 MED ORDER — ENOXAPARIN SODIUM 30 MG/0.3ML IJ SOSY
30.0000 mg | PREFILLED_SYRINGE | INTRAMUSCULAR | Status: DC
  Administered 2023-04-15 – 2023-04-17 (×3): 30 mg via SUBCUTANEOUS
  Filled 2023-04-15 (×3): qty 0.3

## 2023-04-15 MED ORDER — ATORVASTATIN CALCIUM 80 MG PO TABS
80.0000 mg | ORAL_TABLET | Freq: Every day | ORAL | Status: DC
  Administered 2023-04-15 – 2023-04-17 (×3): 80 mg via ORAL
  Filled 2023-04-15: qty 1
  Filled 2023-04-15: qty 2
  Filled 2023-04-15: qty 1

## 2023-04-15 MED ORDER — DEXTROSE 50 % IV SOLN
INTRAVENOUS | Status: AC
  Filled 2023-04-15: qty 50

## 2023-04-15 MED ORDER — CARVEDILOL 12.5 MG PO TABS
12.5000 mg | ORAL_TABLET | Freq: Two times a day (BID) | ORAL | Status: DC
  Administered 2023-04-15 – 2023-04-16 (×3): 12.5 mg via ORAL
  Filled 2023-04-15 (×3): qty 1

## 2023-04-15 MED ORDER — ACETAMINOPHEN 325 MG PO TABS: 650.0000 mg | ORAL_TABLET | ORAL | Status: DC | PRN

## 2023-04-15 MED ORDER — STROKE: EARLY STAGES OF RECOVERY BOOK
Freq: Once | Status: AC
  Filled 2023-04-15 (×2): qty 1

## 2023-04-15 MED ORDER — SODIUM CHLORIDE 0.9 % IV SOLN: INTRAVENOUS | Status: DC

## 2023-04-15 MED ORDER — ACETAMINOPHEN 650 MG RE SUPP: 650.0000 mg | RECTAL | Status: DC | PRN

## 2023-04-15 MED ORDER — PANTOPRAZOLE SODIUM 40 MG PO TBEC
40.0000 mg | DELAYED_RELEASE_TABLET | Freq: Every day | ORAL | Status: DC
  Administered 2023-04-15 – 2023-04-17 (×3): 40 mg via ORAL
  Filled 2023-04-15 (×3): qty 1

## 2023-04-15 MED ORDER — IOHEXOL 350 MG/ML SOLN: 100.0000 mL | Freq: Once | INTRAVENOUS | Status: DC | PRN

## 2023-04-15 MED ORDER — INSULIN ASPART 100 UNIT/ML IJ SOLN
0.0000 [IU] | Freq: Three times a day (TID) | INTRAMUSCULAR | Status: DC
  Administered 2023-04-16: 2 [IU] via SUBCUTANEOUS

## 2023-04-15 MED ORDER — SENNOSIDES-DOCUSATE SODIUM 8.6-50 MG PO TABS: 1.0000 | ORAL_TABLET | Freq: Every evening | ORAL | Status: DC | PRN

## 2023-04-15 MED ORDER — ASPIRIN 81 MG PO TBEC
81.0000 mg | DELAYED_RELEASE_TABLET | Freq: Every day | ORAL | Status: DC
  Administered 2023-04-16 – 2023-04-17 (×2): 81 mg via ORAL
  Filled 2023-04-15 (×2): qty 1

## 2023-04-15 MED ORDER — ALLOPURINOL 100 MG PO TABS
100.0000 mg | ORAL_TABLET | Freq: Every day | ORAL | Status: DC
  Administered 2023-04-15 – 2023-04-17 (×3): 100 mg via ORAL
  Filled 2023-04-15 (×3): qty 1

## 2023-04-15 MED ORDER — HYDRALAZINE HCL 20 MG/ML IJ SOLN: 10.0000 mg | Freq: Three times a day (TID) | INTRAMUSCULAR | Status: DC | PRN

## 2023-04-15 MED ORDER — ATENOLOL 25 MG PO TABS: 25.0000 mg | ORAL_TABLET | Freq: Every day | ORAL | Status: DC

## 2023-04-15 MED ORDER — ASPIRIN 81 MG PO CHEW
324.0000 mg | CHEWABLE_TABLET | Freq: Once | ORAL | Status: AC
  Administered 2023-04-15: 324 mg via ORAL
  Filled 2023-04-15: qty 4

## 2023-04-15 NOTE — Assessment & Plan Note (Signed)
-  Patient presented with hypoglycemia. -Will check A1c -Holding all oral hypoglycemic agents -SSI and Close CBG monitoring will be provided.

## 2023-04-15 NOTE — Assessment & Plan Note (Signed)
-  Chronic renal failure -Continue sodium bicarbonate tablet 3 times a day as previously prescribed.

## 2023-04-15 NOTE — ED Notes (Signed)
Code stroke per DR Hyacinth Meeker 07:38

## 2023-04-15 NOTE — Assessment & Plan Note (Signed)
-  Continue minimizing agents -Gentle fluid resuscitation will be provided (patient Cr slightly higher than our available results). -Follow renal function trend.

## 2023-04-15 NOTE — Assessment & Plan Note (Signed)
-  Patient symptoms completely resolved while in the ED. -Mild hypoglycemia present at time of admission; most likely contributing to symptomatic presentation. -Case discussed with neurology service with instructions to place in the hospital for stroke workup, MRI, echo, carotid Dopplers, TSH, B12 and the use of aspirin and Plavix -Patient will be transferred to [facility] for a stroke workup and neurology service involvement. -Of note, prior to admission patient was not using any antiplatelet therapy for prevention purposes.

## 2023-04-15 NOTE — Progress Notes (Signed)
CBC 151

## 2023-04-15 NOTE — ED Notes (Addendum)
CBC 59, pt asymptomatic and given juice, crackers, and peanut butter. Dr. Gwenlyn Perking notified.

## 2023-04-15 NOTE — H&P (Signed)
History and Physical    Patient: Kristopher Hogan XBJ:[id number] DOB: 06/21/1966 DOA: 04/15/2023 DOS: the patient was seen and examined on 04/15/2023 PCP: [facility]  Patient coming from: Home  Chief Complaint:  Chief Complaint  Patient presents with   Fall   Code Stroke   HPI: Kristopher Hogan is a 57 y.o. male with medical history significant of type 2 diabetes with nephropathy, hypertension, hyperlipidemia, history of gout and symptomatic bradycardia status post pacemaker implantation about 2 weeks ago; who presented to the hospital secondary to aphasia and left-sided weakness.  Patient reported waking up this morning with intention to use the bathroom where he ended up falling without truly being aware of the fall or any surrounding symptoms family members following.  At the moment he had noticed some left-sided weakness and difficulty speaking/finding words.  Patient was found to be slightly hypoglycemic with blood sugar in the 50-60 range by EMS.  In the ED CT scan of the head demonstrated no acute intracranial abnormalities.  Patient's symptoms resolved prior to my evaluation.  Patient denies chest pain, nausea, vomiting, abdominal pain, dysuria/hematuria, melena, hematochezia, shortness of breath or any other complaints.  Case was discussed with neurology service who recommended admission for TIA/stroke workup.  Review of Systems: As mentioned in the history of present illness. All other systems reviewed and are negative. Past Medical History:  Diagnosis Date   Arthritis    Diabetes mellitus    Gout    Hypertension    Renal disorder    Past Surgical History:  Procedure Laterality Date   KNEE ARTHROSCOPY     PACEMAKER INSERTION Left 04/08/2023   Social History:  reports that he has been smoking cigars and cigarettes. He has been smoking an average of 1 pack per day. He has never used smokeless tobacco. He reports that he does not drink alcohol and does not use  drugs.  Allergies  Allergen Reactions   Penicillins     Has patient had a PCN reaction causing immediate rash, facial/tongue/throat swelling, SOB or lightheadedness with hypotension: No Has patient had a PCN reaction causing severe rash involving mucus membranes or skin necrosis: Yes Has patient had a PCN reaction that required hospitalization No Has patient had a PCN reaction occurring within the last 10 years: No If all of the above answers are "NO", then may proceed with Cephalosporin use.     No family history on file.  Prior to Admission medications   Medication Sig Start Date End Date Taking? Authorizing Provider  atenolol (TENORMIN) 25 MG tablet Take 25 mg by mouth daily.    [provider]  indomethacin (INDOCIN) 50 MG capsule Take 1 capsule (50 mg total) by mouth 2 (two) times daily with a meal. Until pain tolerable. 03/26/17   Triplett, Tammy, PA-C  lisinopril (PRINIVIL,ZESTRIL) 5 MG tablet Take 5 mg by mouth daily.    [provider]  naproxen (NAPROSYN) 500 MG tablet Take 1 tablet (500 mg total) by mouth 2 (two) times daily. 06/24/19   Horton, [name], MD  oxyCODONE-acetaminophen (PERCOCET/ROXICET) 5-325 MG tablet Take 1 tablet by mouth every 4 (four) hours as needed. 03/26/17   Pauline Aus, PA-C    Physical Exam: Vitals:   04/15/23 0800 04/15/23 0803 04/15/23 0816  BP: (!) 172/86  (!) 156/88  Pulse: (!) 35    Resp: 20  20  Temp:   98 F (36.7 C)  TempSrc:   Oral  SpO2: 100%  100%  Weight:  78 kg   Height:  6' (1.829 m)    General exam: Alert, awake, oriented x 3; good interaction and no complaints at time of my evaluation. Respiratory system: Clear to auscultation. Respiratory effort normal.  Good saturation on room air. Cardiovascular system: Rate controlled, no rubs, no gallops, no JVD. Gastrointestinal system: Abdomen is nondistended, soft and nontender. No organomegaly or masses felt. Normal bowel sounds heard. Central nervous system:  Moving 4 limbs spontaneously; at time of exam no complaining of any focal deficits.  There may be some subjective left-sided decreased sensation, but no other abnormality seen. Extremities: No cyanosis, clubbing or edema. Skin: No AKI. Psychiatry: Judgement and insight appear normal. Mood & affect appropriate.   Data Reviewed: CT head without contrast: Demonstrated no acute intracranial abnormalities; no bleeding.   CBC: White blood cell 6.6, hemoglobin 10.9 Comprehensive metabolic panel: Sodium 136, potassium 4.6, chloride 110, bicarb 16, BUN 69, creatinine 4.10 and a GFR of 16. Assessment and Plan: * Stroke-like symptoms -Patient symptoms completely resolved while in the ED. -Mild hypoglycemia present at time of admission; most likely contributing to symptomatic presentation. -Case discussed with neurology service with instructions to place in the hospital for stroke workup, MRI, echo, carotid Dopplers, TSH, B12 and the use of aspirin and Plavix -Patient will be transferred to [facility] for a stroke workup and neurology service involvement. -Of note, prior to admission patient was not using any antiplatelet therapy for prevention purposes.  HLD (hyperlipidemia) -Updated lipid panel -Continue the use of Lipitor.  GERD (gastroesophageal reflux disease) -Continue PPI.  Metabolic acidosis -Chronic renal failure -Continue sodium bicarbonate tablet 3 times a day as previously prescribed.  HTN (hypertension) -Allowing for permissive hypertension in the next 24 hours given TIA/stroke presentation. -Holding patient's nifedipine and losartan. -Continue treatment with carvedilol -As needed hydralazine has been ordered -Heart healthy/low-sodium diet discussed with patient.  CKD (chronic kidney disease) stage 4, GFR 15-29 ml/min (HCC) -Continue minimizing agents -Gentle fluid resuscitation will be provided (patient Cr slightly higher than our available results). -Follow renal  function trend.   Gout -No acute flare currently appreciated -Continue the use of allopurinol.  Type 2 diabetes with nephropathy (HCC) -Patient presented with hypoglycemia. -Will check A1c -Holding all oral hypoglycemic agents -SSI and Close CBG monitoring will be provided.   Advance Care Planning:   Code Status: Full Code   Consults: Neurology service  Family Communication: Family member at bedside.  Severity of Illness: `The appropriate patient status for this patient is OBSERVATION. Observation status is judged to be reasonable and necessary in order to provide the required intensity of service to ensure the patient's safety. The patient's presenting symptoms, physical exam findings, and initial radiographic and laboratory data in the context of their medical condition is felt to place them at decreased risk for further clinical deterioration. Furthermore, it is anticipated that the patient will be medically stable for discharge from the hospital within 2 midnights of admission.   Author: Vassie Loll, MD 04/15/2023 9:23 AM  For on call review [email or web address].

## 2023-04-15 NOTE — Assessment & Plan Note (Signed)
-  Updated lipid panel -Continue the use of Lipitor.

## 2023-04-15 NOTE — Progress Notes (Signed)
CBG 52, Dr. Hyacinth Meeker aware and ordered 1 amp D50

## 2023-04-15 NOTE — Progress Notes (Signed)
Neurologist, Dr Vicente Masson, joins the telemedicine cart at [phone].

## 2023-04-15 NOTE — ED Provider Notes (Signed)
[place] EMERGENCY DEPARTMENT AT [facility] Provider Note   CSN: [id number] Arrival date & time: 04/15/23  4098     History  Chief Complaint  Patient presents with   Kristopher Hogan is a 57 y.o. male.   Fall   This patient is a 57 year old male, history of hypertension on lisinopril and atenolol, he also states that he is a diabetic, he had a pacemaker placed at the [facility] in [place] last week, he went to bed at 930 last night, tried to walk to the bathroom this morning and fell, this was unwitnessed but heard by a family member who found him on the ground.  The patient had no recollection of this fall and does not remember anything from this morning.  He presents with a blood sugar that was around 66 by EMS with focal neurologic deficits on the left including left-sided facial droop, difficulty with speech, left arm weakness and dysmetria of the left arm.  He was noted to have frequent PVCs on cardiac monitoring by paramedics.  They placed him on oxygen.  Last seen normal was apparently 930 last night, the patient does not recall this morning and does not know if he had symptoms when he first woke up, he just remembers he tried to walk to the bathroom and fell but does not remember why he fell or how long he was on the ground    Home Medications Prior to Admission medications   Medication Sig Start Date End Date Taking? Authorizing Provider  atenolol (TENORMIN) 25 MG tablet Take 25 mg by mouth daily.    [provider]  indomethacin (INDOCIN) 50 MG capsule Take 1 capsule (50 mg total) by mouth 2 (two) times daily with a meal. Until pain tolerable. 03/26/17   Triplett, Tammy, PA-C  lisinopril (PRINIVIL,ZESTRIL) 5 MG tablet Take 5 mg by mouth daily.    [provider]  naproxen (NAPROSYN) 500 MG tablet Take 1 tablet (500 mg total) by mouth 2 (two) times daily. 06/24/19   Horton, [name], MD  oxyCODONE-acetaminophen (PERCOCET/ROXICET) 5-325 MG  tablet Take 1 tablet by mouth every 4 (four) hours as needed. 03/26/17   Triplett, Tammy, PA-C  predniSONE (DELTASONE) 20 MG tablet Take 2 po QD x4d then 1 po QD x 4d 07/08/19   Devoria Albe, MD      Allergies    Penicillins    Review of Systems   Review of Systems  All other systems reviewed and are negative.   Physical Exam Updated Vital Signs There were no vitals taken for this visit. Physical Exam Vitals and nursing note reviewed.  Constitutional:      General: He is not in acute distress.    Appearance: He is well-developed.  HENT:     Head: Normocephalic and atraumatic.     Mouth/Throat:     Pharynx: No oropharyngeal exudate.  Eyes:     General: No scleral icterus.       Right eye: No discharge.        Left eye: No discharge.     Conjunctiva/sclera: Conjunctivae normal.     Pupils: Pupils are equal, round, and reactive to light.  Neck:     Thyroid: No thyromegaly.     Vascular: No JVD.  Cardiovascular:     Rate and Rhythm: Normal rate.     Heart sounds: Normal heart sounds. No murmur heard.    No friction rub. No gallop.  Comments: Frequent ectopy, no murmur Pulmonary:     Effort: Pulmonary effort is normal. No respiratory distress.     Breath sounds: Normal breath sounds. No wheezing or rales.  Abdominal:     General: Bowel sounds are normal. There is no distension.     Palpations: Abdomen is soft. There is no mass.     Tenderness: There is no abdominal tenderness.  Musculoskeletal:        General: No tenderness. Normal range of motion.     Cervical back: Normal range of motion and neck supple.  Lymphadenopathy:     Cervical: No cervical adenopathy.  Skin:    General: Skin is warm and dry.     Findings: No erythema or rash.  Neurological:     Mental Status: He is alert.     Coordination: Coordination normal.     Comments: Left-sided facial droop, slight difficulty with speech, left arm weakness, hand weakness and dysmetria with finger-nose-finger.  Normal  heel shin bilaterally, normal level of alertness  Psychiatric:        Behavior: Behavior normal.     ED Results / Procedures / Treatments   Labs (all labs ordered are listed, but only abnormal results are displayed) Labs Reviewed  CBG MONITORING, ED - Abnormal; Notable for the following components:      Result Value   Glucose-Capillary 52 (*)    All other components within normal limits  ETHANOL  PROTIME-INR  APTT  CBC  DIFFERENTIAL  COMPREHENSIVE METABOLIC PANEL  ETHANOL  RAPID URINE DRUG SCREEN, HOSP PERFORMED  URINALYSIS, ROUTINE W REFLEX MICROSCOPIC  I-STAT CHEM 8, ED    EKG None  Radiology No results found.  Procedures Procedures    Medications Ordered in ED Medications  dextrose 50 % solution (has no administration in time range)    ED Course/ Medical Decision Making/ A&P Clinical Course as of 04/15/23 0858  Thu Apr 15, 2023  0806 Reevaluated after CT scan, symptoms have resolved associated with glucose being treated.  He is currently has a glucose of 151 at 8:00 AM, he has normal strength in all 4 extremities, no facial droop, normal speech and no dysmetria.  Code stroke was activated however due to traffic issues the neurologist was not able to be part of the call, a backup plan of calling telemetry specialist has been initiated.  CT scan is negative for acute stroke or hemorrhage [BM]  [phone] Care was discussed with the neurologist by telemedicine, they recommend admission to the hospital with repeat CT scan in 24 hours, stroke workup, full dose aspirin and 300 mg of Plavix changing to baby aspirin and 75 mg of Plavix tomorrow. [BM]  [phone] Patient had exam repeated multiple times and seems to be back to normal without any recurrence and speech deficits or left-sided weakness [BM]  0833 Hospitalist paged for admission [BM]    Clinical Course User Index [BM] Eber Hong, MD                             Medical Decision Making Amount and/or Complexity of  Data Reviewed Labs: ordered. Radiology: ordered.  Risk OTC drugs. Prescription drug management. Decision regarding hospitalization.    This patient presents to the ED for concern of fall with an acute neurologic deficit, this involves an extensive number of treatment options, and is a complaint that carries with it a high risk of complications and morbidity.  The differential diagnosis  includes hypoglycemia, stroke, hemorrhage, ischemia, pacemaker dysfunction   Co morbidities that complicate the patient evaluation  Diabetes hypertension and bradycardia requiring pacemaker placement   Additional history obtained:  Additional history obtained from electronic medical record External records from outside source obtained and reviewed including echocardiogram from April 30 showing preserved cardiac function, moderate left ventricular dilatation and mild concentric LVH ultrasound with no DVT   Lab Tests:  I Ordered, and personally interpreted labs.  The pertinent results include:  hypoglycemia - corrected, otherwise unremarkable.   Imaging Studies ordered:  I ordered imaging studies including CT scan of the head I independently visualized and interpreted imaging which showed no acute findings. I agree with the radiologist interpretation   Cardiac Monitoring: / EKG:  The patient was maintained on a cardiac monitor.  I personally viewed and interpreted the cardiac monitored which showed an underlying rhythm of: Paced rhythm with PVCs   Consultations Obtained:  I requested consultation with the neurology,  and discussed lab and imaging findings as well as pertinent plan - they recommend: admit - Consulted with hospitalist - Dr. Alferd Patee who will admit    Problem List / ED Course / Critical interventions / Medication management  Code stroke activated immediately upon arrival though truly the patient's last seen normal was around 930 last night, it is not clear whether he had a  normal exam when he first woke up this morning as he fell upon trying to walk to the bathroom.  Symptoms were present based on EMS evaluation upon their arrival. I ordered medication including asa and plavix  for stroke at Neuro request  Reevaluation of the patient after these medicines showed that the patient improved I have reviewed the patients home medicines and have made adjustments as needed   Social Determinants of Health:  Recent pacemaker   Test / Admission - Considered:  Will need admission to the hospital         Final Clinical Impression(s) / ED Diagnoses Final diagnoses:  TIA (transient ischemic attack)  Hypoglycemia     Eber Hong, MD 04/15/23 0900

## 2023-04-15 NOTE — Assessment & Plan Note (Signed)
-  No acute flare currently appreciated -Continue the use of allopurinol.

## 2023-04-15 NOTE — Progress Notes (Addendum)
Patient back from imaging. Imaging cancelled by Dr Hyacinth Meeker-  due to renal function. Dr Lilian Kapur notified.

## 2023-04-15 NOTE — Assessment & Plan Note (Signed)
Continue PPI ?

## 2023-04-15 NOTE — Progress Notes (Signed)
Patient back to CT department for CTA/P.

## 2023-04-15 NOTE — Assessment & Plan Note (Signed)
-  Allowing for permissive hypertension in the next 24 hours given TIA/stroke presentation. -Holding patient's nifedipine and losartan. -Continue treatment with carvedilol -As needed hydralazine has been ordered -Heart healthy/low-sodium diet discussed with patient.

## 2023-04-15 NOTE — ED Triage Notes (Addendum)
Pt brought in by [facility] from home for fall. Pt had pacemaker placed last week, today he got up to use restroom and fell. He does not remember falling and does not remember anything until his family was helping him get up. EMS reports pt not speaking clearly and left grip is weak. EDP at bedside. LKW approximately 9:30 pm last night

## 2023-04-15 NOTE — ED Notes (Signed)
Report given to David with [facility] 

## 2023-04-15 NOTE — Consult Note (Signed)
[facility] TeleNeurology Consult Services   Patient Name:   Quency, Kitzinger Date of Birth:   [birth year] Identification Number:   MRN - [id number] Date of Service:   04/15/2023 08:00:21  Diagnosis:       R29.810 - Facial numbness/ Facial weakness  Impression:      Maruice Mitman is a 57 yo M w/ a hx of HTN, DM, and CKD who presents with left sided weakness. No deficits on exam at present. Head CT showed no acute intracranial pathology. Favoring TIA as etiology underlying presentation but cannot entirely exclude symptomatic hypoglycemia. Recommend the following:  - ASA 325 mg now, transition to 81 mg daily  - Plavix 300 mg now, transition to 75 mg daily  - Goal BP is normotension  - Bedside swallow eval  - MRI brain w/o contrast  - Carotid ultrasound  -Telemetry  -TTE  -hemoglobin A1c, lipid panel  -Neurology to follow    Our recommendations are outlined below.  Recommendations:        Stroke/Telemetry Floor       Neuro Checks       Bedside Swallow Eval       DVT Prophylaxis       IV Fluids, Normal Saline       Head of Bed 30 Degrees       Euglycemia and Avoid Hyperthermia (PRN Acetaminophen)  Sign Out:       Discussed with Emergency Department Provider    ------------------------------------------------------------------------------  Advanced Imaging: Advanced Imaging Deferred because:  Non-disabling symptoms as verified by the patient; no cortical signs so not consistent with LVO   Metrics: Last Known Well: 04/14/2023 21:30:00 [facility] Notification Time: 04/15/2023 08:00:21 Arrival Time: 04/15/2023 07:29:00 Stamp Time: 04/15/2023 08:00:21 Initial Response Time: 04/15/2023 08:04:14 Symptoms: left sided weakness. Initial patient interaction: 04/15/2023 08:04:55 NIHSS Assessment Completed: 04/15/2023 08:08:00 Patient is not a candidate for Thrombolytic. Thrombolytic Medical Decision: 04/15/2023 08:11:00 Patient was not deemed candidate for  Thrombolytic because of following reasons: Last Well Known Above 4.5 Hours.  CT head showed no acute hemorrhage or acute core infarct.  Primary Provider Notified of Diagnostic Impression and Management Plan on: 04/15/2023 08:25:00    ------------------------------------------------------------------------------  History of Present Illness: Patient is a 57 year old Male.  Patient was brought by EMS for symptoms of left sided weakness. Constantinos Arballo is a 57 yo M w/ a hx of HTN, DM, and CKD who presents with left sided weakness. LKN at 2130. Woke up with gait instability and weakness. Per ED provider, had left hemiparesis upon arrival. Blood glucose was 52 at that time. Denies history of ischemic stroke.    Past Medical History:      Hypertension      Diabetes Mellitus  Medications:  No Anticoagulant use  No Antiplatelet use Reviewed EMR for current medications  Allergies:  Reviewed  Social History: Smoking: No Alcohol Use: No  Family History:  There is no family history of premature cerebrovascular disease pertinent to this consultation  ROS : 14 Points Review of Systems was performed and was negative except mentioned in HPI.  Past Surgical History: There Is No Surgical History Contributory To Today's Visit    Examination: BP(156/88), Pulse(76), Blood Glucose(151) 1A: Level of Consciousness - Alert; keenly responsive + 0 1B: Ask Month and Age - Both Questions Right + 0 1C: Blink Eyes & Squeeze Hands - Performs Both Tasks + 0 2: Test Horizontal Extraocular Movements - Normal + 0 3: Test Visual Fields - No Visual Loss +  0 4: Test Facial Palsy (Use Grimace if Obtunded) - Normal symmetry + 0 5A: Test Left Arm Motor Drift - No Drift for 10 Seconds + 0 5B: Test Right Arm Motor Drift - No Drift for 10 Seconds + 0 6A: Test Left Leg Motor Drift - No Drift for 5 Seconds + 0 6B: Test Right Leg Motor Drift - No Drift for 5 Seconds + 0 7: Test Limb Ataxia (FNF/Heel-Shin)  - No Ataxia + 0 8: Test Sensation - Normal; No sensory loss + 0 9: Test Language/Aphasia - Normal; No aphasia + 0 10: Test Dysarthria - Normal + 0 11: Test Extinction/Inattention - No abnormality + 0  NIHSS Score: 0   Pre-Morbid Modified Rankin Scale: 0 Points = No symptoms at all  Spoke with : ED Provider  Patient/Family was informed the Neurology Consult would occur via TeleHealth consult by way of interactive audio and video telecommunications and consented to receiving care in this manner.   Patient is being evaluated for possible acute neurologic impairment and high probability of imminent or life-threatening deterioration. I spent total of 27 minutes providing care to this patient, including time for face to face visit via telemedicine, review of medical records, imaging studies and discussion of findings with providers, the patient and/or family.   Dr Vicente Masson   [facility] For Inpatient follow-up with [facility] physician please call [facility] [phone]. This is not an outpatient service. Post hospital discharge, please contact hospital directly.  Please do not communicate with [facility] physicians via secure chat. If you have any questions, Please contact [facility]. Please call or reconsult our service if there are any clinical or diagnostic changes.

## 2023-04-15 NOTE — Progress Notes (Signed)
1610 Stroke Alert cart activation. Patient back from imaging done prior to alert.

## 2023-04-16 ENCOUNTER — Observation Stay (HOSPITAL_COMMUNITY): Payer: No Typology Code available for payment source

## 2023-04-16 ENCOUNTER — Encounter (HOSPITAL_COMMUNITY): Payer: Self-pay | Admitting: Internal Medicine

## 2023-04-16 ENCOUNTER — Observation Stay (HOSPITAL_BASED_OUTPATIENT_CLINIC_OR_DEPARTMENT_OTHER): Payer: No Typology Code available for payment source

## 2023-04-16 DIAGNOSIS — E785 Hyperlipidemia, unspecified: Secondary | ICD-10-CM | POA: Diagnosis present

## 2023-04-16 DIAGNOSIS — R4701 Aphasia: Secondary | ICD-10-CM | POA: Diagnosis present

## 2023-04-16 DIAGNOSIS — G8194 Hemiplegia, unspecified affecting left nondominant side: Secondary | ICD-10-CM | POA: Diagnosis present

## 2023-04-16 DIAGNOSIS — Y92019 Unspecified place in single-family (private) house as the place of occurrence of the external cause: Secondary | ICD-10-CM | POA: Diagnosis not present

## 2023-04-16 DIAGNOSIS — F17290 Nicotine dependence, other tobacco product, uncomplicated: Secondary | ICD-10-CM | POA: Diagnosis present

## 2023-04-16 DIAGNOSIS — N184 Chronic kidney disease, stage 4 (severe): Secondary | ICD-10-CM | POA: Diagnosis present

## 2023-04-16 DIAGNOSIS — I480 Paroxysmal atrial fibrillation: Secondary | ICD-10-CM | POA: Diagnosis present

## 2023-04-16 DIAGNOSIS — M109 Gout, unspecified: Secondary | ICD-10-CM | POA: Diagnosis present

## 2023-04-16 DIAGNOSIS — I5022 Chronic systolic (congestive) heart failure: Secondary | ICD-10-CM | POA: Diagnosis present

## 2023-04-16 DIAGNOSIS — I5021 Acute systolic (congestive) heart failure: Secondary | ICD-10-CM

## 2023-04-16 DIAGNOSIS — Z7984 Long term (current) use of oral hypoglycemic drugs: Secondary | ICD-10-CM | POA: Diagnosis not present

## 2023-04-16 DIAGNOSIS — E8722 Chronic metabolic acidosis: Secondary | ICD-10-CM | POA: Diagnosis present

## 2023-04-16 DIAGNOSIS — I10 Essential (primary) hypertension: Secondary | ICD-10-CM | POA: Diagnosis not present

## 2023-04-16 DIAGNOSIS — R2990 Unspecified symptoms and signs involving the nervous system: Secondary | ICD-10-CM | POA: Diagnosis present

## 2023-04-16 DIAGNOSIS — R55 Syncope and collapse: Secondary | ICD-10-CM | POA: Diagnosis not present

## 2023-04-16 DIAGNOSIS — I471 Supraventricular tachycardia, unspecified: Secondary | ICD-10-CM | POA: Diagnosis present

## 2023-04-16 DIAGNOSIS — W19XXXA Unspecified fall, initial encounter: Secondary | ICD-10-CM | POA: Diagnosis present

## 2023-04-16 DIAGNOSIS — R569 Unspecified convulsions: Secondary | ICD-10-CM | POA: Diagnosis not present

## 2023-04-16 DIAGNOSIS — K219 Gastro-esophageal reflux disease without esophagitis: Secondary | ICD-10-CM | POA: Diagnosis present

## 2023-04-16 DIAGNOSIS — I493 Ventricular premature depolarization: Secondary | ICD-10-CM | POA: Diagnosis present

## 2023-04-16 DIAGNOSIS — Z79899 Other long term (current) drug therapy: Secondary | ICD-10-CM | POA: Diagnosis not present

## 2023-04-16 DIAGNOSIS — Z950 Presence of cardiac pacemaker: Secondary | ICD-10-CM | POA: Diagnosis not present

## 2023-04-16 DIAGNOSIS — F17210 Nicotine dependence, cigarettes, uncomplicated: Secondary | ICD-10-CM | POA: Diagnosis present

## 2023-04-16 DIAGNOSIS — G459 Transient cerebral ischemic attack, unspecified: Secondary | ICD-10-CM

## 2023-04-16 DIAGNOSIS — R32 Unspecified urinary incontinence: Secondary | ICD-10-CM | POA: Diagnosis present

## 2023-04-16 DIAGNOSIS — R29810 Facial weakness: Secondary | ICD-10-CM | POA: Diagnosis present

## 2023-04-16 DIAGNOSIS — I130 Hypertensive heart and chronic kidney disease with heart failure and stage 1 through stage 4 chronic kidney disease, or unspecified chronic kidney disease: Secondary | ICD-10-CM | POA: Diagnosis present

## 2023-04-16 DIAGNOSIS — N179 Acute kidney failure, unspecified: Secondary | ICD-10-CM | POA: Diagnosis present

## 2023-04-16 DIAGNOSIS — F1021 Alcohol dependence, in remission: Secondary | ICD-10-CM | POA: Diagnosis present

## 2023-04-16 DIAGNOSIS — E1122 Type 2 diabetes mellitus with diabetic chronic kidney disease: Secondary | ICD-10-CM | POA: Diagnosis present

## 2023-04-16 DIAGNOSIS — E11649 Type 2 diabetes mellitus with hypoglycemia without coma: Secondary | ICD-10-CM | POA: Diagnosis present

## 2023-04-16 LAB — ECHOCARDIOGRAM COMPLETE
AR max vel: 3.43 cm2
AV Area VTI: 3.42 cm2
AV Area mean vel: 3.48 cm2
AV Mean grad: 3 mmHg
AV Peak grad: 6 mmHg
Ao pk vel: 1.22 m/s
Area-P 1/2: 4.89 cm2
Height: 71 in
MV M vel: 5.31 m/s
MV Peak grad: 112.8 mmHg
S' Lateral: 4.3 cm
Single Plane A4C EF: 39.9 %
Weight: 2768.98 oz

## 2023-04-16 LAB — LIPID PANEL
Cholesterol: 104 mg/dL (ref 0–200)
HDL: 39 mg/dL — ABNORMAL LOW (ref >40)
LDL Cholesterol: 52 mg/dL (ref 0–99)
Total CHOL/HDL Ratio: 2.7 RATIO
Triglycerides: 67 mg/dL (ref <150)
VLDL: 13 mg/dL (ref 0–40)

## 2023-04-16 LAB — CBC
HCT: 28.2 % — ABNORMAL LOW (ref 39.0–52.0)
Hemoglobin: 9.6 g/dL — ABNORMAL LOW (ref 13.0–17.0)
MCH: 28.9 pg (ref 26.0–34.0)
MCHC: 34 g/dL (ref 30.0–36.0)
MCV: 84.9 fL (ref 80.0–100.0)
Platelets: 188 10*3/uL (ref 150–400)
RBC: 3.32 MIL/uL — ABNORMAL LOW (ref 4.22–5.81)
RDW: 18.4 % — ABNORMAL HIGH (ref 11.5–15.5)
WBC: 5.5 10*3/uL (ref 4.0–10.5)
nRBC: 0 % (ref 0.0–0.2)

## 2023-04-16 LAB — RENAL FUNCTION PANEL
Albumin: 2.8 g/dL — ABNORMAL LOW (ref 3.5–5.0)
Anion gap: 7 (ref 5–15)
BUN: 70 mg/dL — ABNORMAL HIGH (ref 6–20)
CO2: 17 mmol/L — ABNORMAL LOW (ref 22–32)
Calcium: 7.7 mg/dL — ABNORMAL LOW (ref 8.9–10.3)
Chloride: 112 mmol/L — ABNORMAL HIGH (ref 98–111)
Creatinine, Ser: 4.4 mg/dL — ABNORMAL HIGH (ref 0.61–1.24)
GFR, Estimated: 15 mL/min — ABNORMAL LOW (ref >60)
Glucose, Bld: 129 mg/dL — ABNORMAL HIGH (ref 70–99)
Phosphorus: 5.8 mg/dL — ABNORMAL HIGH (ref 2.5–4.6)
Potassium: 4.7 mmol/L (ref 3.5–5.1)
Sodium: 136 mmol/L (ref 135–145)

## 2023-04-16 LAB — URINALYSIS, ROUTINE W REFLEX MICROSCOPIC
Bacteria, UA: NONE SEEN
Bilirubin Urine: NEGATIVE
Glucose, UA: >=500 mg/dL — AB
Hgb urine dipstick: NEGATIVE
Ketones, ur: NEGATIVE mg/dL
Leukocytes,Ua: NEGATIVE
Nitrite: NEGATIVE
Protein, ur: 100 mg/dL — AB
Specific Gravity, Urine: 1.008 (ref 1.005–1.030)
pH: 5 (ref 5.0–8.0)

## 2023-04-16 LAB — GLUCOSE, CAPILLARY
Glucose-Capillary: 99 mg/dL (ref 70–99)
Glucose-Capillary: 194 mg/dL — ABNORMAL HIGH (ref 70–99)
Glucose-Capillary: 105 mg/dL — ABNORMAL HIGH (ref 70–99)
Glucose-Capillary: 124 mg/dL — ABNORMAL HIGH (ref 70–99)

## 2023-04-16 LAB — OSMOLALITY: Osmolality: 315 mOsm/kg — ABNORMAL HIGH (ref 275–295)

## 2023-04-16 LAB — SODIUM, URINE, RANDOM: Sodium, Ur: 94 mmol/L

## 2023-04-16 LAB — BRAIN NATRIURETIC PEPTIDE: B Natriuretic Peptide: 571.2 pg/mL — ABNORMAL HIGH (ref 0.0–100.0)

## 2023-04-16 LAB — OSMOLALITY, URINE: Osmolality, Ur: 409 mOsm/kg (ref 300–900)

## 2023-04-16 LAB — CREATININE, URINE, RANDOM: Creatinine, Urine: 61 mg/dL

## 2023-04-16 MED ORDER — AMLODIPINE BESYLATE 10 MG PO TABS
10.0000 mg | ORAL_TABLET | Freq: Every day | ORAL | Status: DC
  Administered 2023-04-16: 10 mg via ORAL
  Filled 2023-04-16: qty 1

## 2023-04-16 MED ORDER — ISOSORBIDE MONONITRATE ER 30 MG PO TB24
30.0000 mg | ORAL_TABLET | Freq: Every day | ORAL | Status: DC
  Administered 2023-04-16 – 2023-04-17 (×2): 30 mg via ORAL
  Filled 2023-04-16 (×2): qty 1

## 2023-04-16 MED ORDER — HYDRALAZINE HCL 25 MG PO TABS
25.0000 mg | ORAL_TABLET | Freq: Three times a day (TID) | ORAL | Status: DC
  Administered 2023-04-16 – 2023-04-17 (×3): 25 mg via ORAL
  Filled 2023-04-16 (×3): qty 1

## 2023-04-16 MED ORDER — CARVEDILOL 25 MG PO TABS
25.0000 mg | ORAL_TABLET | Freq: Two times a day (BID) | ORAL | Status: DC
  Administered 2023-04-17: 25 mg via ORAL
  Filled 2023-04-16: qty 1

## 2023-04-16 NOTE — Progress Notes (Signed)
  Echocardiogram 2D Echocardiogram has been performed.  Kristopher Hogan Wynn Banker 04/16/2023, 11:25 AM

## 2023-04-16 NOTE — Plan of Care (Signed)
  Problem: Education: Goal: Knowledge of disease or condition will improve Outcome: Progressing Goal: Knowledge of secondary prevention will improve (MUST DOCUMENT ALL) Outcome: Progressing Goal: Knowledge of patient specific risk factors will improve (Mark N/A or DELETE if not current risk factor) Outcome: Progressing   

## 2023-04-16 NOTE — Evaluation (Signed)
Occupational Therapy Evaluation and Discharge Patient Details Name: Kristopher Hogan MRN: [id number] DOB: [birth year] Today's Date: 04/16/2023   History of Present Illness Pt is a 57 y.o. male admitted to [facility] 04/15/23 secondary to with episode of syncope with aphasia and left-sided weakness. Pt with PMH significant of type 2 diabetes with nephropathy, hypertension, hyperlipidemia, CKD, metabolic acidosis, history of gout, and symptomatic bradycardia status post pacemaker implantation 04/08/23.   Clinical Impression   Pt Independent to Mod I with ADLs, functional transfers, and functional mobility with intermittent use of SPC at baseline PLOF. Pt currently presents at baseline PLOF demonstrating ability to complete ADLs, functional transfers, and functional mobility Independent to Mod I. Pt with episode of hypertension during session. MD present at end of OT eval and notified of pt elevated BP. No additional acute skilled OT services are indicated at this time. Post acute discharge, no skilled OT follow-up is indicated at this time.     Recommendations for follow up therapy are one component of a multi-disciplinary discharge planning process, led by the attending physician.  Recommendations may be updated based on patient status, additional functional criteria and insurance authorization.   Assistance Recommended at Discharge PRN  Patient can return home with the following Assist for transportation    Functional Status Assessment  Patient has not had a recent decline in their functional status  Equipment Recommendations  None recommended by OT    Recommendations for Other Services       Precautions / Restrictions Precautions Precautions: Fall Restrictions Weight Bearing Restrictions: No      Mobility Bed Mobility Overal bed mobility: Independent                  Transfers Overall transfer level: Independent Equipment used: None                      Balance Overall  balance assessment: Independent                                         ADL either performed or assessed with clinical judgement   ADL Overall ADL's : Independent;At baseline;Modified independent                                       General ADL Comments: Pt at baseline functional level with ADLs at this time.     Vision Baseline Vision/History: 1 Wears glasses (wears reading glasses) Ability to See in Adequate Light: 0 Adequate Patient Visual Report: No change from baseline Additional Comments: Pt vision appears WFL. Per chart, pt family noted vision changes at time of syncope resulting in hospital admission, but pt denies any recent changes in vision.     Perception     Praxis Praxis Praxis tested?: Within functional limits    Pertinent Vitals/Pain Pain Assessment Pain Assessment: No/denies pain     Hand Dominance Right   Extremity/Trunk Assessment Upper Extremity Assessment Upper Extremity Assessment: Overall WFL for tasks assessed   Lower Extremity Assessment Lower Extremity Assessment: Defer to PT evaluation   Cervical / Trunk Assessment Cervical / Trunk Assessment: Normal   Communication Communication Communication: No difficulties   Cognition Arousal/Alertness: Awake/alert Behavior During Therapy: WFL for tasks assessed/performed Overall Cognitive Status: Within Functional Limits for tasks assessed  General Comments  Pt with elevated BP this session (172/103 in sitting, 187/79 in standing). All other VSS on RA. MD present at end of OT eval and notified of elevated BP. Pt family members present throughout session.     Exercises     Shoulder Instructions      Home Living Family/patient expects to be discharged to:: Private residence Living Arrangements: Other relatives Available Help at Discharge: Family;Available 24 hours/day Type of Home: House Home Access: Stairs  to enter [employer] of Steps: 2 Entrance Stairs-Rails: Right;Left Home Layout: One level     Bathroom Shower/Tub: Chief Strategy Officer: Standard     Home Equipment: Cane - single point   Additional Comments: lives with cousin      Prior Functioning/Environment Prior Level of Function : Driving;History of Falls (last six months)             Mobility Comments: SPC intermittently, 2 falls recently reports blacking out ADLs Comments: At baseline pt in Independent to Mod I with ADLs.        OT Problem List:        OT Treatment/Interventions:      OT Goals(Current goals can be found in the care plan section) Acute Rehab OT Goals Patient Stated Goal: Pt would like to return home and find out why he had syncopal episodes.  OT Frequency:      Co-evaluation              AM-PAC OT "6 Clicks" Daily Activity     Outcome Measure Help from another person eating meals?: None Help from another person taking care of personal grooming?: None Help from another person toileting, which includes using toliet, bedpan, or urinal?: None Help from another person bathing (including washing, rinsing, drying)?: None Help from another person to put on and taking off regular upper body clothing?: None Help from another person to put on and taking off regular lower body clothing?: None 6 Click Score: 24   End of Session Nurse Communication: Mobility status  Activity Tolerance: Patient tolerated treatment well Patient left: in bed;with call bell/phone within reach;with family/visitor present;Other (comment) (with MD and radiology technologist present)                   Time: 1610-9604 OT Time Calculation (min): 15 min Charges:  OT General Charges $OT Visit: 1 Visit OT Evaluation $OT Eval Low Complexity: 1 Low  Andrez Lieurance "Orson Eva., OTR/L, MA Acute Rehab [phone]   Lendon Colonel 04/16/2023, 4:31 PM

## 2023-04-16 NOTE — Progress Notes (Signed)
EEG complete - results pending 

## 2023-04-16 NOTE — Progress Notes (Signed)
PROGRESS NOTE                                                                                                                                                                                                             Patient Demographics:    Kristopher Hogan, is a 57 y.o. male, DOB - 1966/05/25, ZOX:[id number]  Outpatient Primary MD for the patient is [facility]    LOS - 0  Admit date - 04/15/2023    Chief Complaint  Patient presents with   Fall   Code Stroke       Brief Narrative (HPI from H&P)   57 y.o. male with medical history significant of type 2 diabetes with nephropathy, CKDstage IV, hypertension, hyperlipidemia, history of gout and symptomatic bradycardia status post pacemaker implantation about 2 weeks ago; who presented to the hospital after another episode of passing out with urinary incontinence, apparently has been having these episodes for several months, family members thought that he was slurring her speech and had some left-sided weakness although patient denies it, he was subsequently sent to [facility] ER from where he was sent to [facility] for further workup.  Patient states clearly that for the last several months he has been having these episodes of passing out with urinary incontinence, during this workup he also had a pacemaker placement few weeks ago at [facility].  In the ER thus far workup is negative including head CT.   Subjective:    Kristopher Hogan today has, No headache, No chest pain, No abdominal pain - No Nausea, No new weakness tingling or numbness, no SOB.   Assessment  & Plan :    Multiple syncopal episodes with urinary incontinence.    -Patient symptoms completely resolved while in the ED. there was question of left-sided weakness and slurred speech however patient not aware of this, he claims that he has had multiple recent episodes of syncope with urinary incontinence,  head CT x 1 negative, MRI brain cannot be done due to recent placement of pacemaker few weeks ago.  Second CT scan ordered for 04/16/2023, will also order EEG, currently he is on DAPT, have requested neurology to evaluate the patient as well.  Symptoms currently resolved full stroke workup and follow-up on results.  HLD (hyperlipidemia) -Updated lipid panel -Continue the use of Lipitor.  GERD (gastroesophageal reflux disease) -Continue PPI.  Metabolic acidosis -Chronic renal failure -Continue sodium bicarbonate tablet 3 times a day as previously prescribed.  HTN (hypertension) -Blood pressure stable continue titrating.  AKI on by CKD (chronic kidney disease) stage 4, GFR 15-29 ml/min (HCC) -Get renal ultrasound, last creatinine few years ago is around 3.5, his CKD could have progressed, will check urine electrolytes along with a UA as well.  Avoid nephrotoxins and monitor.  Gout -No acute flare currently appreciated -Continue the use of allopurinol.  Type 2 diabetes with nephropathy (HCC) -Patient presented with hypoglycemia. -Will check A1c -Holding all oral hypoglycemic agents -SSI and Close CBG monitoring will be provided.  Lab Results  Component Value Date   HGBA1C 4.5 (L) 04/15/2023   CBG (last 3)  Recent Labs    04/15/23 2353 04/16/23 0847 04/16/23 1206  GLUCAP 143* 99 194*   Lab Results  Component Value Date   CHOL 104 04/16/2023   HDL 39 (L) 04/16/2023   LDLCALC 52 04/16/2023   TRIG 67 04/16/2023   CHOLHDL 2.7 04/16/2023         Condition - Fair  Family Communication  :  None present  Code Status :  Full  Consults  :  Neuro  PUD Prophylaxis : PPI   Procedures  :     MRI - cannot be done due to recent pacemaker  Renal US -   EEG -  CT Head x 2 -   CT Head x 1 - No acute finding.  TTE -  1. Frequent PVCs. Left ventricular ejection fraction, by estimation, is 35 to 40%. The left ventricle has moderately decreased function. The left  ventricle demonstrates global hypokinesis. There is mild left ventricular hypertrophy. Left ventricular diastolic parameters are indeterminate.  2. Right ventricular systolic function is mildly reduced. The right ventricular size is mildly enlarged. There is mildly elevated pulmonary artery systolic pressure. The estimated right ventricular systolic pressure is 36.2 mmHg.  3. Left atrial size was mildly dilated.  4. The mitral valve is normal in structure. Trivial mitral valve regurgitation. No evidence of mitral stenosis.  5. The aortic valve is tricuspid. Aortic valve regurgitation is not visualized. No aortic stenosis is present.  6. The inferior vena cava is dilated in size with <50% respiratory variability, suggesting right atrial pressure of 15 mmHg.   Carotids - 1. Minor carotid atherosclerosis. Negative for significant stenosis. Degree of narrowing less than 50% bilaterally by ultrasound criteria. 2. Patent antegrade vertebral flow bilaterally.      Disposition Plan  :    Status is: Observation  DVT Prophylaxis  :    enoxaparin (LOVENOX) injection 30 mg Start: 04/15/23 1000    Lab Results  Component Value Date   PLT 188 04/16/2023    Diet :  Diet Order             Diet heart healthy/carb modified Room service appropriate? Yes; Fluid consistency: Thin  Diet effective now                    Inpatient Medications  Scheduled Meds:   stroke: early stages of recovery book   Does not apply Once   allopurinol  100 mg Oral Daily   aspirin EC  81 mg Oral Daily   atorvastatin  80 mg Oral Daily   carvedilol  12.5 mg Oral BID WC   enoxaparin (LOVENOX) injection  30 mg Subcutaneous Q24H   insulin aspart  0-9 Units  Subcutaneous TID WC   pantoprazole  40 mg Oral Daily   sodium bicarbonate  650 mg Oral TID   Continuous Infusions: PRN Meds:.acetaminophen **OR** [DISCONTINUED] acetaminophen (TYLENOL) oral liquid 160 mg/5 mL **OR** [DISCONTINUED] acetaminophen, hydrALAZINE,  iohexol, senna-docusate  Antibiotics  :    Anti-infectives (From admission, onward)    None         Objective:   Vitals:   04/16/23 0800 04/16/23 0840 04/16/23 0900 04/16/23 1100  BP: (!) 173/85   (!) 159/84  Pulse: 74  76   Resp: 18  16   Temp:  98 F (36.7 C)  98 F (36.7 C)  TempSrc:  Oral  Oral  SpO2: 99%  98%   Weight:      Height:        Wt Readings from Last 3 Encounters:  04/15/23 78.5 kg  07/27/21 81.6 kg  07/07/19 93.4 kg     Intake/Output Summary (Last 24 hours) at 04/16/2023 1214 Last data filed at 04/16/2023 1610 Gross per 24 hour  Intake 321.38 ml  Output 1375 ml  Net -1053.62 ml     Physical Exam  Awake Alert, No new F.N deficits, Normal affect [place].AT,PERRAL Supple Neck, No JVD,   Symmetrical Chest wall movement, Good air movement bilaterally, CTAB RRR,No Gallops,Rubs or new Murmurs,  +ve B.Sounds, Abd Soft, No tenderness,   No Cyanosis, Clubbing or edema       Data Review:    Recent Labs  Lab 04/15/23 0743 04/16/23 0438  WBC 6.6 5.5  HGB 10.9* 9.6*  HCT 32.3* 28.2*  PLT 219 188  MCV 86.6 84.9  MCH 29.2 28.9  MCHC 33.7 34.0  RDW 18.4* 18.4*  LYMPHSABS 1.0  --   MONOABS 0.4  --   EOSABS 0.4  --   BASOSABS 0.1  --     Recent Labs  Lab 04/15/23 0743 04/15/23 1003 04/16/23 0438 04/16/23 0908  NA 136  --  136  --   K 4.6  --  4.7  --   CL 110  --  112*  --   CO2 16*  --  17*  --   ANIONGAP 10  --  7  --   GLUCOSE 234*  --  129*  --   BUN 69*  --  70*  --   CREATININE 4.10*  --  4.40*  --   AST 17  --   --   --   ALT 17  --   --   --   ALKPHOS 115  --   --   --   BILITOT 0.7  --   --   --   ALBUMIN 3.7  --  2.8*  --   INR 1.0  --   --   --   TSH  --  1.585  --   --   HGBA1C  --  4.5*  --   --   BNP  --   --   --  571.2*  CALCIUM 7.7*  --  7.7*  --      Recent Labs  Lab 04/15/23 0743 04/15/23 1003 04/16/23 0438 04/16/23 0908  INR 1.0  --   --   --   TSH  --  1.585  --   --   HGBA1C  --  4.5*  --   --    BNP  --   --   --  571.2*  CALCIUM 7.7*  --  7.7*  --  Lab Results  Component Value Date   CHOL 104 04/16/2023   HDL 39 (L) 04/16/2023   LDLCALC 52 04/16/2023   TRIG 67 04/16/2023   CHOLHDL 2.7 04/16/2023    Lab Results  Component Value Date   HGBA1C 4.5 (L) 04/15/2023    Recent Labs    04/15/23 1003  TSH 1.585     Radiology Reports ECHOCARDIOGRAM COMPLETE  Result Date: 04/16/2023    ECHOCARDIOGRAM REPORT   Patient Name:   BRONC BUCKBEE Date of Exam: 04/16/2023 Medical Rec #:  [id number]        Height:       71.0 in Accession #:    [id number]       Weight:       173.1 lb Date of Birth:  June 04, 1966         BSA:          1.983 m Patient Age:    57 years         BP:           145/74 mmHg Patient Gender: M                HR:           74 bpm. Exam Location:  Inpatient Procedure: 2D Echo, Cardiac Doppler and Color Doppler Indications:    Stroke  History:        Patient has no prior history of Echocardiogram examinations.                 Stroke; Risk Factors:Hypertension, Diabetes and HLD.  Sonographer:    Lucy Antigua Referring Phys: Vassie Loll IMPRESSIONS  1. Frequent PVCs. Left ventricular ejection fraction, by estimation, is 35 to 40%. The left ventricle has moderately decreased function. The left ventricle demonstrates global hypokinesis. There is mild left ventricular hypertrophy. Left ventricular diastolic parameters are indeterminate.  2. Right ventricular systolic function is mildly reduced. The right ventricular size is mildly enlarged. There is mildly elevated pulmonary artery systolic pressure. The estimated right ventricular systolic pressure is 36.2 mmHg.  3. Left atrial size was mildly dilated.  4. The mitral valve is normal in structure. Trivial mitral valve regurgitation. No evidence of mitral stenosis.  5. The aortic valve is tricuspid. Aortic valve regurgitation is not visualized. No aortic stenosis is present.  6. The inferior vena cava is dilated in size with <50%  respiratory variability, suggesting right atrial pressure of 15 mmHg. FINDINGS  Left Ventricle: Left ventricular ejection fraction, by estimation, is 35 to 40%. The left ventricle has moderately decreased function. The left ventricle demonstrates global hypokinesis. The left ventricular internal cavity size was normal in size. There is mild left ventricular hypertrophy. Left ventricular diastolic parameters are indeterminate. Right Ventricle: The right ventricular size is mildly enlarged. No increase in right ventricular wall thickness. Right ventricular systolic function is mildly reduced. There is mildly elevated pulmonary artery systolic pressure. The tricuspid regurgitant  velocity is 2.30 m/s, and with an assumed right atrial pressure of 15 mmHg, the estimated right ventricular systolic pressure is 36.2 mmHg. Left Atrium: Left atrial size was mildly dilated. Right Atrium: Right atrial size was normal in size. Pericardium: There is no evidence of pericardial effusion. Mitral Valve: The mitral valve is normal in structure. Trivial mitral valve regurgitation. No evidence of mitral valve stenosis. Tricuspid Valve: The tricuspid valve is normal in structure. Tricuspid valve regurgitation is trivial. Aortic Valve: The aortic valve is tricuspid. Aortic valve regurgitation is not visualized.  No aortic stenosis is present. Aortic valve mean gradient measures 3.0 mmHg. Aortic valve peak gradient measures 6.0 mmHg. Aortic valve area, by VTI measures 3.42 cm. Pulmonic Valve: The pulmonic valve was not well visualized. Pulmonic valve regurgitation is trivial. Aorta: The aortic root and ascending aorta are structurally normal, with no evidence of dilitation. Venous: The inferior vena cava is dilated in size with less than 50% respiratory variability, suggesting right atrial pressure of 15 mmHg. IAS/Shunts: The interatrial septum was not well visualized.  LEFT VENTRICLE PLAX 2D LVIDd:         5.20 cm      Diastology LVIDs:          4.30 cm      LV e' medial:    4.35 cm/s LV PW:         1.25 cm      LV E/e' medial:  14.9 LV IVS:        1.15 cm      LV e' lateral:   6.64 cm/s LVOT diam:     2.20 cm      LV E/e' lateral: 9.7 LV SV:         90 LV SV Index:   45 LVOT Area:     3.80 cm  LV Volumes (MOD) LV vol d, MOD A4C: 145.0 ml LV vol s, MOD A4C: 87.1 ml LV SV MOD A4C:     145.0 ml RIGHT VENTRICLE             IVC RV S prime:     13.40 cm/s  IVC diam: 2.50 cm TAPSE (M-mode): 1.6 cm LEFT ATRIUM             Index        RIGHT ATRIUM           Index LA Vol (A2C):   55.2 ml 27.84 ml/m  RA Area:     18.50 cm LA Vol (A4C):   82.3 ml 41.51 ml/m  RA Volume:   60.90 ml  30.71 ml/m LA Biplane Vol: 68.5 ml 34.55 ml/m  AORTIC VALVE AV Area (Vmax):    3.43 cm AV Area (Vmean):   3.48 cm AV Area (VTI):     3.42 cm AV Vmax:           122.00 cm/s AV Vmean:          74.100 cm/s AV VTI:            0.262 m AV Peak Grad:      6.0 mmHg AV Mean Grad:      3.0 mmHg LVOT Vmax:         110.00 cm/s LVOT Vmean:        67.900 cm/s LVOT VTI:          0.236 m LVOT/AV VTI ratio: 0.90  AORTA Ao Root diam: 3.60 cm Ao Asc diam:  3.30 cm MITRAL VALVE               TRICUSPID VALVE MV Area (PHT): 4.89 cm    TR Peak grad:   21.2 mmHg MV Decel Time: 155 msec    TR Vmax:        230.00 cm/s MR Peak grad: 112.8 mmHg MR Vmax:      531.00 cm/s  SHUNTS MV E velocity: 64.70 cm/s  Systemic VTI:  0.24 m MV A velocity: 73.20 cm/s  Systemic Diam: 2.20 cm MV E/A ratio:  0.88 Epifanio Lesches MD Electronically  signed by Epifanio Lesches MD Signature Date/Time: 04/16/2023/11:56:45 AM    Final    US Carotid Bilateral (at [facility] and [facility] only)  Result Date: 04/15/2023 CLINICAL DATA:  Stroke symptoms, hypertension, diabetes EXAM: BILATERAL CAROTID DUPLEX ULTRASOUND TECHNIQUE: Wallace Cullens scale imaging, color Doppler and duplex ultrasound were performed of bilateral carotid and vertebral arteries in the neck. COMPARISON:  None Available. FINDINGS: Criteria: Quantification of carotid  stenosis is based on velocity parameters that correlate the residual internal carotid diameter with NASCET-based stenosis levels, using the diameter of the distal internal carotid lumen as the denominator for stenosis measurement. The following velocity measurements were obtained: RIGHT ICA: 61/22 cm/sec CCA: 98/18 cm/sec SYSTOLIC ICA/CCA RATIO:  0.6 ECA: 95 cm/sec LEFT ICA: 73/36 cm/sec CCA: 58/13 cm/sec SYSTOLIC ICA/CCA RATIO:  1.3 ECA: 96 cm/sec RIGHT CAROTID ARTERY: Trace intimal thickening and very thin bifurcation hypoechoic atherosclerosis. Negative for significant stenosis, velocity elevation, turbulent flow. RIGHT VERTEBRAL ARTERY:  Antegrade LEFT CAROTID ARTERY: Similar intimal thickening and trace thin hypoechoic atherosclerosis. Negative for significant stenosis, velocity elevation, or turbulent flow. LEFT VERTEBRAL ARTERY:  Antegrade IMPRESSION: 1. Minor carotid atherosclerosis. Negative for significant stenosis. Degree of narrowing less than 50% bilaterally by ultrasound criteria. 2. Patent antegrade vertebral flow bilaterally. Electronically Signed   By: Judie Petit.  Shick M.D.   On: 04/15/2023 10:12   DG Chest Portable 1 View  Result Date: 04/15/2023 CLINICAL DATA:  Fall EXAM: PORTABLE CHEST 1 VIEW COMPARISON:  Chest radiograph dated 12/16/2007 FINDINGS: Left chest wall pacemaker leads project over the right atrium and ventricle. Normal lung volumes. No focal consolidations. No pleural effusion or pneumothorax. The heart size and mediastinal contours are within normal limits. No radiographic finding of acute displaced fracture. IMPRESSION: 1. No acute cardiopulmonary process. 2.  No radiographic finding of acute displaced fracture. Electronically Signed   By: Agustin Cree M.D.   On: 04/15/2023 08:31   CT HEAD CODE STROKE WO CONTRAST  Result Date: 04/15/2023 CLINICAL DATA:  Code stroke.  Fall. EXAM: CT HEAD WITHOUT CONTRAST TECHNIQUE: Contiguous axial images were obtained from the base of the skull through  the vertex without intravenous contrast. RADIATION DOSE REDUCTION: This exam was performed according to the departmental dose-optimization program which includes automated exposure control, adjustment of the mA and/or kV according to patient size and/or use of iterative reconstruction technique. COMPARISON:  None Available. FINDINGS: Brain: No evidence of acute infarction, hemorrhage, hydrocephalus, extra-axial collection or mass lesion/mass effect. Mild low-density in the cerebral white matter usually from chronic small vessel disease. Vascular: No hyperdense vessel or unexpected calcification. Skull: Normal. Negative for fracture or focal lesion. Sinuses/Orbits: No acute finding. Other: Prelim sent to Dr. Hyacinth Meeker. IMPRESSION: No acute finding. Electronically Signed   By: Tiburcio Pea M.D.   On: 04/15/2023 07:56      Signature  -   Susa Raring M.D on 04/16/2023 at 12:14 PM   -  To page go to [email or web address]

## 2023-04-16 NOTE — Evaluation (Addendum)
Physical Therapy Evaluation and Discharge Patient Details Name: ESLEY CREGER MRN: [id number] DOB: 15-Jul-1966 Today's Date: 04/16/2023  History of Present Illness  Pt  is a 57 y.o. male admitted to [facility] 04/15/23 secondary to with episode of syncope with aphasia and left-sided weakness. CT negative for acute findings. EEG WNL. 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  Clinical Impression  Pt presents today functioning at or close to his mobility baseline. Pt reports symptoms have resolved, able to perform all mobility independently, no dizziness or symptoms provoked with mobility today. Pt reports no concerns with mobility upon discharge, no further need for acute PT at this time, no current need for PT upon discharge. Recommend returning home, PT signing off.        Recommendations for follow up therapy are one component of a multi-disciplinary discharge planning process, led by the attending physician.  Recommendations may be updated based on patient status, additional functional criteria and insurance authorization.  Follow Up Recommendations       Assistance Recommended at Discharge PRN  Patient can return home with the following  Assist for transportation    Equipment Recommendations None recommended by PT  Recommendations for Other Services       Functional Status Assessment Patient has not had a recent decline in their functional status     Precautions / Restrictions Precautions Precautions: Fall Restrictions Weight Bearing Restrictions: No      Mobility  Bed Mobility Overal bed mobility: Independent                  Transfers Overall transfer level: Independent Equipment used: None                    Ambulation/Gait Ambulation/Gait assistance: Independent Gait Distance (Feet): 250  Feet Assistive device: None Gait Pattern/deviations: Step-through pattern, WFL(Within Functional Limits) Gait velocity: WFL     General Gait Details: no noticeable gait deficits, able to perform head turns, changes in gait speed, and 180 degree turns without symptoms or LOB, gait speed stable throughout session  Stairs            Wheelchair Mobility    Modified Rankin (Stroke Patients Only)       Balance Overall balance assessment: No apparent balance deficits (not formally assessed)                                           Pertinent Vitals/Pain Pain Assessment Pain Assessment: No/denies pain    Home Living Family/patient expects to be discharged to:: Private residence Living Arrangements: Other relatives Available Help at Discharge: Family;Available 24 hours/day Type of Home: House Home Access: Stairs to enter Entrance Stairs-Rails: Doctor, general practice of Steps: 2   Home Layout: One level Home Equipment: Cane - single point Additional Comments: lives with cousin    Prior Function Prior Level of Function : Driving;History of Falls (last six months)             Mobility Comments: SPC intermittently, 2 falls recently reports blacking out ADLs Comments: At baseline pt in Independent to Mod I with ADLs.     Hand Dominance   Dominant Hand: Right    Extremity/Trunk Assessment   Upper Extremity Assessment Upper Extremity Assessment: Defer to OT evaluation  Lower Extremity Assessment Lower Extremity Assessment: RLE deficits/detail RLE Deficits / Details: LLE grossly WFL, RLE knee extension lacking ~20 degrees from neutral extension in sitting, questionable effort/hamstring length, with pt reporting previous surgeries, strength and sensation equal bilaterally    Cervical / Trunk Assessment Cervical / Trunk Assessment: Normal  Communication   Communication: No difficulties  Cognition Arousal/Alertness:  Awake/alert Behavior During Therapy: WFL for tasks assessed/performed Overall Cognitive Status: Within Functional Limits for tasks assessed                                          General Comments General comments (skin integrity, edema, etc.): VSS on room air    Exercises     Assessment/Plan    PT Assessment Patient does not need any further PT services  PT Problem List         PT Treatment Interventions      PT Goals (Current goals can be found in the Care Plan section)  Acute Rehab PT Goals Patient Stated Goal: go home PT Goal Formulation: All assessment and education complete, DC therapy    Frequency       Co-evaluation               AM-PAC PT "6 Clicks" Mobility  Outcome Measure Help needed turning from your back to your side while in a flat bed without using bedrails?: None Help needed moving from lying on your back to sitting on the side of a flat bed without using bedrails?: None Help needed moving to and from a bed to a chair (including a wheelchair)?: None Help needed standing up from a chair using your arms (e.g., wheelchair or bedside chair)?: None Help needed to walk in hospital room?: None Help needed climbing 3-5 steps with a railing? : None 6 Click Score: 24    End of Session   Activity Tolerance: Patient tolerated treatment well Patient left: in bed;with call bell/phone within reach;with family/visitor present Nurse Communication: Mobility status PT Visit Diagnosis: Other abnormalities of gait and mobility (R26.89)    Time: 1610-9604 PT Time Calculation (min) (ACUTE ONLY): 12 min   Charges:   PT Evaluation $PT Eval Low Complexity: 1 Low          Lindalou Hose, PT DPT Acute Rehabilitation Services Office [phone]   Leonie Man 04/16/2023, 5:08 PM

## 2023-04-16 NOTE — Procedures (Signed)
Patient Name: Kristopher Hogan  MRN: [id number]  Epilepsy Attending: Charlsie Quest  Referring Physician/Provider: Leroy Sea, MD  Date: 04/16/2023 Duration: 22.17 mins  Patient history: 57 yo M w/ a hx of HTN, DM, and CKD who presents with left sided weakness. EEG to evaluate for seizure  Level of alertness: Awake, asleep  AEDs during EEG study: None  Technical aspects: This EEG study was done with scalp electrodes positioned according to the 10-20 International system of electrode placement. Electrical activity was reviewed with band pass filter of 1-70Hz , sensitivity of 7 uV/mm, display speed of 4mm/sec with a 60Hz  notched filter applied as appropriate. EEG data were recorded continuously and digitally stored.  Video monitoring was available and reviewed as appropriate.  Description: The posterior dominant rhythm consists of 9-10 Hz activity of moderate voltage (25-35 uV) seen predominantly in posterior head regions, symmetric and reactive to eye opening and eye closing. Sleep was characterized by vertex waves, sleep spindles (12 to 14 Hz), maximal frontocentral region. Hyperventilation and photic stimulation were not performed.     IMPRESSION: This study is within normal limits. No seizures or epileptiform discharges were seen throughout the recording.  A normal interictal EEG does not exclude the diagnosis of epilepsy.  [name]

## 2023-04-16 NOTE — Consult Note (Addendum)
Cardiology Consultation   Patient ID: DEMBA HULLENDER MRN: [id number]; DOB: 1966/05/04  Admit date: 04/15/2023 Date of Consult: 04/16/2023  PCP:  [facility] Providers Cardiologist:  [facility]      Patient Profile:   Kristopher Hogan is a 57 y.o. 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 Lars Masson of [facility] on 04/08/2023 who is being seen 04/16/2023 for the evaluation of syncope and CHF at the request of Dr. Thedore Mins.  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 Lars Masson of [place] on 04/08/2023. His last ablation was 20 years ago at [facility].  He is no longer on any anticoagulation therapy and does not have any recurrence.  He does not have a general cardiologist.  He does have a nephrologist at the [facility], baseline creatinine is about 4.0.  He is still making urine.  There has been discussion about AV fistula placement and dialysis, however he still has not had AV fistula placement at this time.  Head CT without contrast obtained on 04/05/2023 was normal.  Lower extremity venous Doppler obtained on the same day was negative for DVT.  Patient underwent cardiac MRI on 04/06/2023 which revealed moderate LV dilatation with borderline mild concentric LVH, mild RV and biatrial enlargement, LVEF greater than 50%.  Dilated main pulmonary artery which may be associated with pulmonary arterial hypertension.  No abnormal LGE.  No evidence of infarct, scarring or infiltrative disease.  He had at least 2 episodes of syncope that eventually led to diagnosis of symptomatic bradycardia and [employer] Scientific dual-chamber  placement by Dr. Wynelle Link at [facility] on 04/08/2023.  Since released from the hospital, he has not done any exertional activity.  He denies any chest discomfort or shortness of breath.  He does not check his blood pressure at home.  He can usually get up in the morning and go without any dizziness.  He was recovering well until yesterday 04/15/2023.  He got out of the bed and walked outside.  He did not have any prodromal symptom.  After he walked outside, he passed out.  Family member heard a thud and found him unconscious on the floor.  He was down for roughly 6 minutes before he came to.  He was brought to [facility] for further evaluation.  Initial report mentioned left-sided weakness, however this has resolved.  Initial blood work was significant for creatinine 4.1, hemoglobin 10.9.  Straw-colored urine was 100 protein and rare bacteria.  TSH normal.  Carotid artery ultrasound showed minor plaque.  BNP 571.2.  CT of the head was negative for acute finding.  Renal ultrasound and repeat CT of the head pending.  Echocardiogram obtained on 04/16/2023 showed EF 35 to 40%, frequent PVCs, moderately decreased LVEF, RVSP 36 mmHg, mildly dilated left atrium, trivial MR.  Cardiology service consulted for abnormal echocardiogram and syncope.  Neurology has been consulted and EEG performed.  Museum/gallery curator interrogated his device.  It is functioning normally and that there has been no recent arrhythmia to explain the passing out spell yesterday.  He has 20% A pacing and 70% V  pacing.   Past Medical History:  Diagnosis Date   Arthritis    Diabetes mellitus    Gout    Hypertension    Renal disorder     Past Surgical History:  Procedure Laterality Date   KNEE ARTHROSCOPY     PACEMAKER INSERTION Left 04/08/2023     Home Medications:  Prior to Admission medications   Medication Sig Start Date End Date Taking? Authorizing Provider  allopurinol (ZYLOPRIM) 100 MG tablet Take 50 mg by mouth  every other day.   Yes [provider]  atorvastatin (LIPITOR) 40 MG tablet Take 40 mg by mouth at bedtime.   Yes [provider]  carvedilol (COREG) 12.5 MG tablet Take 12.5 mg by mouth every 12 (twelve) hours.   Yes [provider]  Cholecalciferol 50 MCG (2000 UT) TABS Take 1 tablet by mouth daily.   Yes [provider]  empagliflozin (JARDIANCE) 25 MG TABS tablet Take 0.5 tablets by mouth in the morning.   Yes [provider]  ferrous sulfate 325 (65 FE) MG tablet Take 325 mg by mouth daily.   Yes [provider]  glipiZIDE (GLUCOTROL) 10 MG tablet Take 10 mg by mouth daily before breakfast.   Yes [provider]  losartan (COZAAR) 100 MG tablet Take 100 mg by mouth daily.   Yes [provider]  NIFEdipine (ADALAT CC) 60 MG 24 hr tablet Take 2 tablets by mouth daily.   Yes [provider]  oxyCODONE-acetaminophen (PERCOCET/ROXICET) 5-325 MG tablet Take 1 tablet by mouth every 4 (four) hours as needed. 03/26/17  Yes Triplett, Tammy, PA-C  sodium bicarbonate 650 MG tablet Take 650 mg by mouth 3 (three) times daily.   Yes [provider]    Inpatient Medications: Scheduled Meds:  allopurinol  100 mg Oral Daily   amLODipine  10 mg Oral Daily   aspirin EC  81 mg Oral Daily   atorvastatin  80 mg Oral Daily   carvedilol  12.5 mg Oral BID WC   enoxaparin (LOVENOX) injection  30 mg Subcutaneous Q24H   insulin aspart  0-9 Units Subcutaneous TID WC   pantoprazole  40 mg Oral Daily   sodium bicarbonate  650 mg Oral TID   Continuous Infusions:  PRN Meds: acetaminophen **OR** [DISCONTINUED] acetaminophen (TYLENOL) oral liquid 160 mg/5 mL **OR** [DISCONTINUED] acetaminophen, hydrALAZINE, iohexol, senna-docusate  Allergies:    Allergies  Allergen Reactions   Penicillins     Has patient had a PCN reaction causing immediate rash, facial/tongue/throat swelling, SOB or lightheadedness with hypotension: No Has  patient had a PCN reaction causing severe rash involving mucus membranes or skin necrosis: Yes Has patient had a PCN reaction that required hospitalization No Has patient had a PCN reaction occurring within the last 10 years: No If all of the above answers are "NO", then may proceed with Cephalosporin use.     Social History:   Social History   Socioeconomic History   Marital status: Divorced    Spouse name: Not on file   Number of children: Not on file   Years of education: Not on file   Highest education level: Not on file  Occupational History   Not on file  Tobacco Use   Smoking status: Some Days    Packs/day: 1    Types: Cigars, Cigarettes   Smokeless tobacco: Never  Substance and Sexual Activity   Alcohol use: No   Drug use: Yes    Types: Marijuana  Comment: occasionally   Sexual activity: Not on file  Other Topics Concern   Not on file  Social History Narrative   Not on file   Social Determinants of Health   Financial Resource Strain: Not on file  Food Insecurity: No Food Insecurity (04/15/2023)   Hunger Vital Sign    Worried About Running Out of Food in the Last Year: Never true    Ran Out of Food in the Last Year: Never true  Transportation Needs: No Transportation Needs (04/15/2023)   PRAPARE - Administrator, Civil Service (Medical): No    Lack of Transportation (Non-Medical): No  Physical Activity: Not on file  Stress: Not on file  Social Connections: Not on file  Intimate Partner Violence: Not At Risk (04/15/2023)   Humiliation, Afraid, Rape, and Kick questionnaire    Fear of Current or Ex-Partner: No    Emotionally Abused: No    Physically Abused: No    Sexually Abused: No    Family History:    Family History  Problem Relation Age of Onset   Stroke Father    Arrhythmia Father      ROS:  Please see the history of present illness.   All other ROS reviewed and negative.     Physical Exam/Data:   Vitals:   04/16/23 1100 04/16/23  1432 04/16/23 1433 04/16/23 1535  BP: (!) 159/84 (!) 172/103 (!) 187/79 (!) 157/87  Pulse:      Resp:      Temp: 98 F (36.7 C)   98.5 F (36.9 C)  TempSrc: Oral   Oral  SpO2:      Weight:      Height:        Intake/Output Summary (Last 24 hours) at 04/16/2023 1545 Last data filed at 04/16/2023 1535 Gross per 24 hour  Intake 321.38 ml  Output 2065 ml  Net -1743.62 ml      04/15/2023    5:35 PM 04/15/2023    8:03 AM 07/27/2021    5:54 AM  Last 3 Weights  Weight (lbs) 173 lb 1 oz 172 lb 180 lb  Weight (kg) 78.5 kg 78.019 kg 81.647 kg     Body mass index is 24.14 kg/m.  General:  Well nourished, well developed, in no acute distress HEENT: normal Neck: no JVD Vascular: No carotid bruits; Distal pulses 2+ bilaterally Cardiac:  normal S1, S2; RRR; no murmur  Lungs:  clear to auscultation bilaterally, no wheezing, rhonchi or rales  Abd: soft, nontender, no hepatomegaly  Ext: no edema Musculoskeletal:  No deformities, BUE and BLE strength normal and equal Skin: warm and dry  Neuro:  CNs 2-12 intact, no focal abnormalities noted Psych:  Normal affect   EKG:  The EKG was personally reviewed and demonstrates: Sinus rhythm with bigeminy, Q waves in the inferior lead Telemetry:  Telemetry was personally reviewed and demonstrates: Normal sinus rhythm with PACs, paced rhythm.  Relevant CV Studies:  Cardiac MRI 04/06/2023 MRI CARDIAC W & WO CONTRAST: JAKYLIN, HONAN [id number] DOB[birth year] M  Exm Date: Apr 06, 2023@08 :42 Req Phys: Claudie Revering Pat Loc: CICU/04-06-2023@09 :31 Img Loc: MRI RADIOLOGY Service: MEDICAL    (Case [id number] COMPLETE) MRI CARDIAC W & WO CONTRAST (MRI Detailed) [id number] Contrast Media : Gadolinium Proc Modifiers : Multihance Reason for Study: new complete heart block  Clinical History:  Report Status: Verified Date Reported: Apr 06, 2023 Date Verified: Apr 06, 2023 Verifier E-Sig:/ES/SARAH Earlene Plater CATER, M.D.  Report: Cardiac  MRI  without and with contrast   Indication: 57 years old Male, Reason for Study: new complete  heart block, history of atrial fibrillation status post ablation,  CK D, hypertension, diabetes, presenting with syncope   Comparison: None   TECHNIQUE: Multiplanar, multisequence images of the heart were  obtained. 16 ml of MultiHance intravenous contrast was  administered. No premedication/adverse events.   FINDINGS:   Study is limited due to motion artifact/arrhythmia.   MEASUREMENTS:   Left ventricular end diastolic diameter: About 6.7 cm   Left ventricular end systolic diameter: About 6.4 cm   LV basal anteroseptal wall thickness: About 1.1 cm   LV basal inferolateral wall thickness: About 1 cm   LA diameter - 3 chamber: About 4.3 cm   CHAMBER SIZES:   Left ventricle: Moderately dilated   Right ventricle: Mildly dilated   Left atrium: Mildly dilated   Right atrium: Mildly dilated   LV wall thickness: Borderline mild concentric LVH   FUNCTION:   Left ventricle: Grossly normal (series 10)   - Estimated LVEF: Greater than 50%   - Regional wall motion: Grossly normal   Right ventricle: Grossly normal   VALVES:   Aortic valve: No major stenosis or regurgitation on limited  assessment   Mitral valve: No major stenosis or regurgitation on limited  assessment   Pulmonic valve: Not well visualized   Tricuspid valve: No major stenosis or regurgitation on limited  assessment   LATE GADOLINIUM ENHANCEMENT: No abnormal LGE   THROMBUS: None   PERICARDIUM: Unremarkable   IMAGED THORACIC AORTA: Normal in size   INCIDENTAL: Dilated main pulmonary artery, measuring up to 4.4 cm   Impression: 1. Study is limited due to motion artifact/arrhythmia.   2. Moderate LV dilatation with borderline mild concentric LVH.  Mild RV and biatrial dilatation.   3. Preserved cardiac function.   4. No abnormal LGE. No evidence of infarct, scarring, or  infiltrative disease.    5. Dilated main pulmonary artery, a finding that can be  associated with pulmonary arterial hypertension.   Electronically Signed By: Vivi Barrack Electronically Signed On: 04/06/2023 10:27 AM      Echo 04/16/2023  1. Frequent PVCs. Left ventricular ejection fraction, by estimation, is  35 to 40%. The left ventricle has moderately decreased function. The left  ventricle demonstrates global hypokinesis. There is mild left ventricular  hypertrophy. Left ventricular  diastolic parameters are indeterminate.   2. Right ventricular systolic function is mildly reduced. The right  ventricular size is mildly enlarged. There is mildly elevated pulmonary  artery systolic pressure. The estimated right ventricular systolic  pressure is 36.2 mmHg.   3. Left atrial size was mildly dilated.   4. The mitral valve is normal in structure. Trivial mitral valve  regurgitation. No evidence of mitral stenosis.   5. The aortic valve is tricuspid. Aortic valve regurgitation is not  visualized. No aortic stenosis is present.   6. The inferior vena cava is dilated in size with <50% respiratory  variability, suggesting right atrial pressure of 15 mmHg.     Laboratory Data:  High Sensitivity Troponin:  No results for input(s): "TROPONINIHS" in the last 720 hours.   Chemistry Recent Labs  Lab 04/15/23 0743 04/16/23 0438  NA 136 136  K 4.6 4.7  CL 110 112*  CO2 16* 17*  GLUCOSE 234* 129*  BUN 69* 70*  CREATININE 4.10* 4.40*  CALCIUM 7.7* 7.7*  GFRNONAA 16* 15*  ANIONGAP 10 7    Recent Labs  Lab 04/15/23 0743 04/16/23 0438  PROT 7.6  --   ALBUMIN 3.7 2.8*  AST 17  --   ALT 17  --   ALKPHOS 115  --   BILITOT 0.7  --    Lipids  Recent Labs  Lab 04/16/23 0438  CHOL 104  TRIG 67  HDL 39*  LDLCALC 52  CHOLHDL 2.7    Hematology Recent Labs  Lab 04/15/23 0743 04/16/23 0438  WBC 6.6 5.5  RBC 3.73* 3.32*  HGB 10.9* 9.6*  HCT 32.3* 28.2*  MCV 86.6 84.9  MCH 29.2 28.9  MCHC 33.7  34.0  RDW 18.4* 18.4*  PLT 219 188   Thyroid  Recent Labs  Lab 04/15/23 1003  TSH 1.585    BNP Recent Labs  Lab 04/16/23 0908  BNP 571.2*    DDimer No results for input(s): "DDIMER" in the last 168 hours.   Radiology/Studies:  ECHOCARDIOGRAM COMPLETE  Result Date: 04/16/2023    ECHOCARDIOGRAM REPORT   Patient Name:   Kristopher Hogan Date of Exam: 04/16/2023 Medical Rec #:  [id number]        Height:       71.0 in Accession #:    [id number]       Weight:       173.1 lb Date of Birth:  1966-11-26         BSA:          1.983 m Patient Age:    57 years         BP:           145/74 mmHg Patient Gender: M                HR:           74 bpm. Exam Location:  Inpatient Procedure: 2D Echo, Cardiac Doppler and Color Doppler Indications:    Stroke  History:        Patient has no prior history of Echocardiogram examinations.                 Stroke; Risk Factors:Hypertension, Diabetes and HLD.  Sonographer:    Lucy Antigua Referring Phys: Vassie Loll IMPRESSIONS  1. Frequent PVCs. Left ventricular ejection fraction, by estimation, is 35 to 40%. The left ventricle has moderately decreased function. The left ventricle demonstrates global hypokinesis. There is mild left ventricular hypertrophy. Left ventricular diastolic parameters are indeterminate.  2. Right ventricular systolic function is mildly reduced. The right ventricular size is mildly enlarged. There is mildly elevated pulmonary artery systolic pressure. The estimated right ventricular systolic pressure is 36.2 mmHg.  3. Left atrial size was mildly dilated.  4. The mitral valve is normal in structure. Trivial mitral valve regurgitation. No evidence of mitral stenosis.  5. The aortic valve is tricuspid. Aortic valve regurgitation is not visualized. No aortic stenosis is present.  6. The inferior vena cava is dilated in size with <50% respiratory variability, suggesting right atrial pressure of 15 mmHg. FINDINGS  Left Ventricle: Left ventricular  ejection fraction, by estimation, is 35 to 40%. The left ventricle has moderately decreased function. The left ventricle demonstrates global hypokinesis. The left ventricular internal cavity size was normal in size. There is mild left ventricular hypertrophy. Left ventricular diastolic parameters are indeterminate. Right Ventricle: The right ventricular size is mildly enlarged. No increase in right ventricular wall thickness. Right ventricular systolic function is mildly reduced. There is mildly elevated pulmonary artery systolic pressure. The tricuspid regurgitant  velocity is 2.30  m/s, and with an assumed right atrial pressure of 15 mmHg, the estimated right ventricular systolic pressure is 36.2 mmHg. Left Atrium: Left atrial size was mildly dilated. Right Atrium: Right atrial size was normal in size. Pericardium: There is no evidence of pericardial effusion. Mitral Valve: The mitral valve is normal in structure. Trivial mitral valve regurgitation. No evidence of mitral valve stenosis. Tricuspid Valve: The tricuspid valve is normal in structure. Tricuspid valve regurgitation is trivial. Aortic Valve: The aortic valve is tricuspid. Aortic valve regurgitation is not visualized. No aortic stenosis is present. Aortic valve mean gradient measures 3.0 mmHg. Aortic valve peak gradient measures 6.0 mmHg. Aortic valve area, by VTI measures 3.42 cm. Pulmonic Valve: The pulmonic valve was not well visualized. Pulmonic valve regurgitation is trivial. Aorta: The aortic root and ascending aorta are structurally normal, with no evidence of dilitation. Venous: The inferior vena cava is dilated in size with less than 50% respiratory variability, suggesting right atrial pressure of 15 mmHg. IAS/Shunts: The interatrial septum was not well visualized.  LEFT VENTRICLE PLAX 2D LVIDd:         5.20 cm      Diastology LVIDs:         4.30 cm      LV e' medial:    4.35 cm/s LV PW:         1.25 cm      LV E/e' medial:  14.9 LV IVS:         1.15 cm      LV e' lateral:   6.64 cm/s LVOT diam:     2.20 cm      LV E/e' lateral: 9.7 LV SV:         90 LV SV Index:   45 LVOT Area:     3.80 cm  LV Volumes (MOD) LV vol d, MOD A4C: 145.0 ml LV vol s, MOD A4C: 87.1 ml LV SV MOD A4C:     145.0 ml RIGHT VENTRICLE             IVC RV S prime:     13.40 cm/s  IVC diam: 2.50 cm TAPSE (M-mode): 1.6 cm LEFT ATRIUM             Index        RIGHT ATRIUM           Index LA Vol (A2C):   55.2 ml 27.84 ml/m  RA Area:     18.50 cm LA Vol (A4C):   82.3 ml 41.51 ml/m  RA Volume:   60.90 ml  30.71 ml/m LA Biplane Vol: 68.5 ml 34.55 ml/m  AORTIC VALVE AV Area (Vmax):    3.43 cm AV Area (Vmean):   3.48 cm AV Area (VTI):     3.42 cm AV Vmax:           122.00 cm/s AV Vmean:          74.100 cm/s AV VTI:            0.262 m AV Peak Grad:      6.0 mmHg AV Mean Grad:      3.0 mmHg LVOT Vmax:         110.00 cm/s LVOT Vmean:        67.900 cm/s LVOT VTI:          0.236 m LVOT/AV VTI ratio: 0.90  AORTA Ao Root diam: 3.60 cm Ao Asc diam:  3.30 cm MITRAL VALVE  TRICUSPID VALVE MV Area (PHT): 4.89 cm    TR Peak grad:   21.2 mmHg MV Decel Time: 155 msec    TR Vmax:        230.00 cm/s MR Peak grad: 112.8 mmHg MR Vmax:      531.00 cm/s  SHUNTS MV E velocity: 64.70 cm/s  Systemic VTI:  0.24 m MV A velocity: 73.20 cm/s  Systemic Diam: 2.20 cm MV E/A ratio:  0.88 Epifanio Lesches MD Electronically signed by Epifanio Lesches MD Signature Date/Time: 04/16/2023/11:56:45 AM    Final    US Carotid Bilateral (at [facility] and [facility] only)  Result Date: 04/15/2023 CLINICAL DATA:  Stroke symptoms, hypertension, diabetes EXAM: BILATERAL CAROTID DUPLEX ULTRASOUND TECHNIQUE: Wallace Cullens scale imaging, color Doppler and duplex ultrasound were performed of bilateral carotid and vertebral arteries in the neck. COMPARISON:  None Available. FINDINGS: Criteria: Quantification of carotid stenosis is based on velocity parameters that correlate the residual internal carotid diameter with NASCET-based  stenosis levels, using the diameter of the distal internal carotid lumen as the denominator for stenosis measurement. The following velocity measurements were obtained: RIGHT ICA: 61/22 cm/sec CCA: 98/18 cm/sec SYSTOLIC ICA/CCA RATIO:  0.6 ECA: 95 cm/sec LEFT ICA: 73/36 cm/sec CCA: 58/13 cm/sec SYSTOLIC ICA/CCA RATIO:  1.3 ECA: 96 cm/sec RIGHT CAROTID ARTERY: Trace intimal thickening and very thin bifurcation hypoechoic atherosclerosis. Negative for significant stenosis, velocity elevation, turbulent flow. RIGHT VERTEBRAL ARTERY:  Antegrade LEFT CAROTID ARTERY: Similar intimal thickening and trace thin hypoechoic atherosclerosis. Negative for significant stenosis, velocity elevation, or turbulent flow. LEFT VERTEBRAL ARTERY:  Antegrade IMPRESSION: 1. Minor carotid atherosclerosis. Negative for significant stenosis. Degree of narrowing less than 50% bilaterally by ultrasound criteria. 2. Patent antegrade vertebral flow bilaterally. Electronically Signed   By: Judie Petit.  Shick M.D.   On: 04/15/2023 10:12   DG Chest Portable 1 View  Result Date: 04/15/2023 CLINICAL DATA:  Fall EXAM: PORTABLE CHEST 1 VIEW COMPARISON:  Chest radiograph dated 12/16/2007 FINDINGS: Left chest wall pacemaker leads project over the right atrium and ventricle. Normal lung volumes. No focal consolidations. No pleural effusion or pneumothorax. The heart size and mediastinal contours are within normal limits. No radiographic finding of acute displaced fracture. IMPRESSION: 1. No acute cardiopulmonary process. 2.  No radiographic finding of acute displaced fracture. Electronically Signed   By: Agustin Cree M.D.   On: 04/15/2023 08:31   CT HEAD CODE STROKE WO CONTRAST  Result Date: 04/15/2023 CLINICAL DATA:  Code stroke.  Fall. EXAM: CT HEAD WITHOUT CONTRAST TECHNIQUE: Contiguous axial images were obtained from the base of the skull through the vertex without intravenous contrast. RADIATION DOSE REDUCTION: This exam was performed according to the  departmental dose-optimization program which includes automated exposure control, adjustment of the mA and/or kV according to patient size and/or use of iterative reconstruction technique. COMPARISON:  None Available. FINDINGS: Brain: No evidence of acute infarction, hemorrhage, hydrocephalus, extra-axial collection or mass lesion/mass effect. Mild low-density in the cerebral white matter usually from chronic small vessel disease. Vascular: No hyperdense vessel or unexpected calcification. Skull: Normal. Negative for fracture or focal lesion. Sinuses/Orbits: No acute finding. Other: Prelim sent to Dr. Hyacinth Meeker. IMPRESSION: No acute finding. Electronically Signed   By: Tiburcio Pea M.D.   On: 04/15/2023 07:56     Assessment and Plan:   Newly diagnosed LV dysfunction: EF 35 to 40% on echocardiogram 04/16/2023.  Interestingly, EF was greater than 50% on cardiac MRI obtained on 04/06/2023 at [facility], although report also mentioned LV and RV  dilatation at the time.  He denies any recent exertional symptoms to suggest angina.  He is not a good candidate for any invasive study due to stage IV kidney disease.  He is on prepared for possibility of dialysis at this time.  He has frequent PVCs on telemetry which can also contribute to weakened heart as well.  Records from [place] mention alcohol dependence in remission however patient adamantly denies any alcohol use.  Increase carvedilol to 25 mg twice a day.  Consider discontinuation of amlodipine and add Imdur/hydralazine combination.  Repeat echocardiogram as outpatient in 3 months.  Recurrent syncope: Pacemaker interrogated, no significant arrhythmia to explain yesterday's passing out spell.  Previous to passing out spell occurred as result of symptomatic bradycardia prior to pacemaker placement.  Device is functioning normally.  No further workup at this time from a cardiac perspective.  Symptomatic bradycardia s/p [employer] dual-chamber PPM 04/08/2023:  Device was interrogated by [employer], no significant arrhythmia to explain yesterday's episode of passing out spell.  Left-sided weakness: Resolved.  CT of the head was negative for stroke  Hypertension: Blood pressure elevated in the hospital.  Consider increase carvedilol to help suppress PVCs.  Stop amlodipine and add Imdur/hydralazine combination given LV dysfunction.  Hyperlipidemia  DM2  History of PAF s/p multiple DCCV and ablation x 2  CKD stage IV: Followed by nephrologist at [facility].  He mentioned his nephrologist previously broached the issue on possible dialysis in the future and AV fistula placement.  He says he has not had AV fistula implantation yet.  He is still making urine.   Risk Assessment/Risk Scores:        New York Heart Association (NYHA) Functional Class NYHA Class I  CHA2DS2-VASc Score = 3   This indicates a 3.2% annual risk of stroke. The patient's score is based upon: CHF History: 1 HTN History: 1 Diabetes History: 1 Stroke History: 0 Vascular Disease History: 0 Age Score: 0 Gender Score: 0         For questions or updates, please contact [facility] Please consult [email or web address] for contact info under    Signed, Azalee Course, [place]  04/16/2023 3:45 PM  Patient seen and examined and agree with Azalee Course, PA as detailed above.  In brief, the patient is a  56 y.o. 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   Patient with complex cardiac history as detailed above with known pAfib s/p ablation x2, symptomatic bradycardia s/p PPM placement and CKD IV with baseline Cr ~4 not on HD. He had CMR in 03/2023 at OSH which revealed moderate LV dilation but EF >50%, no LGE/scar. He just underwent PPM placement on 04/08/23 and he states he had a  cath prior to his PPM that showed no evidence of blockages.  He was doing well until yesterday when he had sudden LOC without prodromal symptoms. Device interrogation here revealed 20% a-pacing 70% v-pacing with no arrhythmias to explain syncope. TTE on admission wit EF 35-40% with global hypokinesis.   Overall, syncope does not appear cardiac in nature given reassuring device interrogation and no LGE on CMR per report to suggest scar. Reports he had a recent cath prior to [facility] placement that did not show any evidence of CAD and he denies any anginal symptoms. 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 Will increase his beta blocker and add GDMT as able.  Currently, the patient is comfortable without chest pain or HF symptoms.    GEN: No acute distress.   Neck: No JVD Cardiac: RRR, no murmurs, rubs, or gallops.  Respiratory: Clear to auscultation bilaterally. GI: Soft, nontender, non-distended  MS: No edema; No deformity. Neuro:  Nonfocal  Psych: Normal affect    Plan: -No arrhythmogenic etiology of syncope on device interrogation -TTE with newly reduced EF 35-40% (50% on CMR) with reportedly no disease on recent cath; CMR without LGE or evidence of infiltrative process; ? 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

## 2023-04-16 NOTE — Consult Note (Addendum)
NEUROLOGY CONSULTATION NOTE   Date of service: Apr 16, 2023 Patient Name: Kristopher Hogan MRN:  [id number] DOB:  December 21, 1965 Reason for consult: spells of loss of conciousness  _ _ _   _ __   _ __ _ _  __ __   _ __   __ _  History of Present Illness  Kristopher Hogan is a 57 y.o. male with PMH significant for CKD stage IV, hypertension, hyperlipidemia, paroxysmal atrial fibrillation status post multiple DC cardioversions and ablations x 2, symptomatic bradycardia with recent placement of dual-chamber PPM on 04/08/2023 who presented to the emergency department after an episode of syncope.  Neurology consulted for further evaluation of his syncope with concern for possible CVA versus TIA and seizure-like activity.  Kristopher Hogan notes that 3 weeks ago he had 3 episodes of syncope.  He notes that 2 these episodes happened early in the morning right when he woke up and started walking to the bathroom.  He denies any prodromal symptoms.  These episodes were not witnessed and he is unclear as to how long he was down.  He notes feeling confused for 5 minutes or so after the event.  He did urinate on himself.  Denies defecating on himself or tongue biting. He presented to his cardiologist at the [facility] and due to his history of bradycardia a dual-chamber pacemaker was placed with concern for symptomatic bradycardia.  After placement he felt well until 2 days ago when he was walking on the hallway and had another syncopal event.  Per family member, they heard a thud and went to the hallway and found the patient down on the ground stumbling and mumbling his words.  He was weak on his feet and noticed he had left-sided weakness and so EMS was called.  EMS noted left-sided weakness and slurred speech as well as hypoglycemia with a glucose level of 66.  The symptoms have all resolved and he has not had any syncopal episodes while in the hospital.  Kristopher Hogan denies any similar prior history to this.  He denies any history  of seizures in childhood.  His son, who was present in the room during her examination, noted to have seizures and has been on multiple antiepileptic medications.  He denies any history of TBI or head trauma.  Denies any history of meningitis or encephalitis.  Prior to admission he took atorvastatin 40 mg daily.  After pacemaker placement he notes that [facility] instructed him to stop taking a 81 mg aspirin daily.  In regards to his glipizide he notes that he takes this as needed for hyperglycemia which he measures with a glucometer.  Of note after his syncopal episodes he did not check his glucose levels.  However he reports taking his glipizide as needed based on what he has eaten, and notes he had eaten peanut butter prior to one of these episodes and taken his glipizide for that reason    ROS   As per HPI  Past History   Past Medical History:  Diagnosis Date   Arthritis    Diabetes mellitus    Gout    Hypertension    Renal disorder    Past Surgical History:  Procedure Laterality Date   KNEE ARTHROSCOPY     PACEMAKER INSERTION Left 04/08/2023   History reviewed. No pertinent family history. Social History   Socioeconomic History   Marital status: Divorced    Spouse name: Not on file   Number of  children: Not on file   Years of education: Not on file   Highest education level: Not on file  Occupational History   Not on file  Tobacco Use   Smoking status: Some Days    Packs/day: 1    Types: Cigars, Cigarettes   Smokeless tobacco: Never  Substance and Sexual Activity   Alcohol use: No   Drug use: No   Sexual activity: Not on file  Other Topics Concern   Not on file  Social History Narrative   Not on file   Social Determinants of Health   Financial Resource Strain: Not on file  Food Insecurity: No Food Insecurity (04/15/2023)   Hunger Vital Sign    Worried About Running Out of Food in the Last Year: Never true    Ran Out of Food in the Last Year: Never true   Transportation Needs: No Transportation Needs (04/15/2023)   PRAPARE - Administrator, Civil Service (Medical): No    Lack of Transportation (Non-Medical): No  Physical Activity: Not on file  Stress: Not on file  Social Connections: Not on file   Allergies  Allergen Reactions   Penicillins     Has patient had a PCN reaction causing immediate rash, facial/tongue/throat swelling, SOB or lightheadedness with hypotension: No Has patient had a PCN reaction causing severe rash involving mucus membranes or skin necrosis: Yes Has patient had a PCN reaction that required hospitalization No Has patient had a PCN reaction occurring within the last 10 years: No If all of the above answers are "NO", then may proceed with Cephalosporin use.     Medications   Medications Prior to Admission  Medication Sig Dispense Refill Last Dose   allopurinol (ZYLOPRIM) 100 MG tablet Take 50 mg by mouth every other day.   04/14/2023   atorvastatin (LIPITOR) 40 MG tablet Take 40 mg by mouth at bedtime.   04/14/2023   carvedilol (COREG) 12.5 MG tablet Take 12.5 mg by mouth every 12 (twelve) hours.   04/14/2023 at 2000   Cholecalciferol 50 MCG (2000 UT) TABS Take 1 tablet by mouth daily.   04/14/2023   empagliflozin (JARDIANCE) 25 MG TABS tablet Take 0.5 tablets by mouth in the morning.   04/14/2023   ferrous sulfate 325 (65 FE) MG tablet Take 325 mg by mouth daily.   04/14/2023   glipiZIDE (GLUCOTROL) 10 MG tablet Take 10 mg by mouth daily before breakfast.   04/14/2023   losartan (COZAAR) 100 MG tablet Take 100 mg by mouth daily.   04/14/2023   NIFEdipine (ADALAT CC) 60 MG 24 hr tablet Take 2 tablets by mouth daily.   04/14/2023   oxyCODONE-acetaminophen (PERCOCET/ROXICET) 5-325 MG tablet Take 1 tablet by mouth every 4 (four) hours as needed. 8 tablet 0 UNK   sodium bicarbonate 650 MG tablet Take 650 mg by mouth 3 (three) times daily.   04/14/2023     Vitals   Vitals:   04/16/23 0800 04/16/23 0840 04/16/23 0900  04/16/23 1100  BP: (!) 173/85   (!) 159/84  Pulse: 74  76   Resp: 18  16   Temp:  98 F (36.7 C)  98 F (36.7 C)  TempSrc:  Oral  Oral  SpO2: 99%  98%   Weight:      Height:         Body mass index is 24.14 kg/m.  Physical Exam   General: Laying comfortably in bed; in no acute distress.  HENT: Normal  oropharynx and mucosa. Normal external appearance of ears and nose.  Neck: Normal range of motion CV: Irregular rhythm, no murmurs gallops or rubs. Pulmonary: Symmetric Chest rise. Normal respiratory effort.  Abdomen: Soft to touch, non-tender.  Ext: warm to touch, no edema Skin: No rash appreciated.  Healed abrasion with scabbing to the right knee.  Well-healed scars of the right thigh and right forearm Musculoskeletal: Muscle wasting of the right hand in an ulnar nerve pattern Mental Status: Awake alert and oriented to person place time and situation. Cranial Nerves: II- no visual deficits, peripheral vision intact.   II/IV/VI- extraocular motion intact, mildly saccadic pursuits.  Pupils equal reactive to light.   V/VII- no facial asymmetry, blink reflex intact  VIII- normal hearing to voice  IX/X- no palate abnormalities  XI-shoulder shrug is reduced on the left compared to the right (4 versus 5) XII-able to protrude his tongue without difficulties. Normal speech Motor: 5/5 throughout except for expected weakness of ulnar nerve distribution in the right hand secondary to his chronic right forearm injury, 4+/5 w/ knee extension secondary to his chronic thigh injury, mild bilateral hip flexion weakness 4+/5. Left upper extremity pronation present but no drift Sensory: Normal sensation throughout to touch DTR's: Areflexic right patellar reflex.  2+ left patellar reflex.  3+ right brachioradialis, 2+ left brachioradialis Cerebellar: Normal finger-to-nose and heel-to-shin   Labs   CBC:  Recent Labs  Lab 04/15/23 0743 04/16/23 0438  WBC 6.6 5.5  NEUTROABS 4.6  --   HGB  10.9* 9.6*  HCT 32.3* 28.2*  MCV 86.6 84.9  PLT 219 188    Basic Metabolic Panel:  Lab Results  Component Value Date   NA 136 04/16/2023   K 4.7 04/16/2023   CO2 17 (L) 04/16/2023   GLUCOSE 129 (H) 04/16/2023   BUN 70 (H) 04/16/2023   CREATININE 4.40 (H) 04/16/2023   CALCIUM 7.7 (L) 04/16/2023   GFRNONAA 15 (L) 04/16/2023   GFRAA 39 (L) 07/08/2019   Lipid Panel:  Lab Results  Component Value Date   CHOL 104 04/16/2023   HDL 39 (L) 04/16/2023   LDLCALC 52 04/16/2023   TRIG 67 04/16/2023   CHOLHDL 2.7 04/16/2023   HgbA1c:  Lab Results  Component Value Date   HGBA1C 4.5 (L) 04/15/2023   Urine Drug Screen:     Component Value Date/Time   LABOPIA NONE DETECTED 04/15/2023 0850   COCAINSCRNUR NONE DETECTED 04/15/2023 0850   LABBENZ NONE DETECTED 04/15/2023 0850   AMPHETMU NONE DETECTED 04/15/2023 0850   THCU NONE DETECTED 04/15/2023 0850   LABBARB NONE DETECTED 04/15/2023 0850    Alcohol Level     Component Value Date/Time   [facility] <10 04/15/2023 0743    5/9 CT Head without contrast personally reviewed, agree with radiology:   No acute finding.  5/10 CT head radiology read pending  Carotid US:  1. Minor carotid atherosclerosis. Negative for significant stenosis. Degree of narrowing less than 50% bilaterally by ultrasound criteria. 2. Patent antegrade vertebral flow bilaterally.  rEEG: Normal interictal EEG, no seizures or epileptiform discharges seen.  ECHO:  1. Frequent PVCs. Left ventricular ejection fraction, by estimation, is  35 to 40%. The left ventricle has moderately decreased function. The left  ventricle demonstrates global hypokinesis. There is mild left ventricular  hypertrophy. Left ventricular  diastolic parameters are indeterminate.   2. Right ventricular systolic function is mildly reduced. The right  ventricular size is mildly enlarged. There is mildly elevated pulmonary  artery systolic  pressure. The estimated right ventricular systolic   pressure is 36.2 mmHg.   3. Left atrial size was mildly dilated.   4. The mitral valve is normal in structure. Trivial mitral valve  regurgitation. No evidence of mitral stenosis.   5. The aortic valve is tricuspid. Aortic valve regurgitation is not  visualized. No aortic stenosis is present.   6. The inferior vena cava is dilated in size with <50% respiratory  variability, suggesting right atrial pressure of 15 mmHg.   Impression   Kristopher Hogan is a 57 year old person living with a past medical history of paroxysmal atrial fibrillation status post ablation x 2, symptomatic bradycardia with recent dual-chamber pacemaker placement, type 2 diabetes on glipizide who presented after a syncopal episode with slurred speech and left-sided weakness.  Neurology consulted to assess syncope as well as deficits concerning for CVA versus TIA.  Patient does have some residual weakness however speech symptoms have resolved.  Stroke workup thus far unremarkable with normal head and carotid imaging.  Unfortunately unable to obtain MRI due to recent pacemaker placement. While he has risk factors for a stroke and certainly possible he has had ischemic event, he was also hypoglycemic which would explain his syncope and post-syncope symptoms of slurred speech and weakness. A1c of 4.5% is concerning for recurrent hypoglycemia, especially with glipizide use. With his risk factors and symptoms, would treat with DAPT (if cleared to start by cardiology with recent pacemaker placement) and continue statin therapy.  For his syncopal event seizure is lower on my differential though with his son having seizure history does put him at some risk. Device interrogation performed, cardiology note no arrhythmia genic etiology of syncope. Again, with episode of hypoglycemia suspect this is the underlying etiology for his syncopal event. Would recommend he follow up with PCP and primary team in regards to adjusting diabetic regimen. If  continues to have further events after diabetic regimen adjustments, would need to consider addition of anti-epileptics or admission for spell capture  Recommendations   # Possible TIA vs. Symptomatic hypoglycemia - MRA cannot be completed due to pacemaker, CTA head cannot be completed due to renal function as risks outweigh benefits - Continue DAPT for 21 day course if cleared by cardiology given recent pacemaker implantation and instructions to stop antiplatelet agents - LDL meeting goal, no role for increasing home statin of atorvastatin 40 mg nightly - Consider liberalization of antiglycemic medications given low A1c and hypoglycemia on admission  - Patient would benefit from counseling on use of glipizide and on counting carbs given peanut butter is not likely to increase his blood sugar and use of glipizide after eating peanut butter is likely to result in hypoglycemic events - Pacemaker interrogated and without arrhythmogenic etiology to explain syncopal event, appreciate cardiology evaluation of his newly reduced EF  # Possible seizure  - EEG w/o evidence of seizure like activity - MRI cannot be completed due to pacer, repeat Head CT read pending and will be followed up by inpatient neurology (no obvious acute process on neurology attending review) - If these studies are reassuring, risk of antiseizure medication initiation felt to outweigh benefit - If patient has further events which are unexplained could consider EMU for spell capture, patient has been instructed to check his blood glucose with each of these episodes ______________________________________________________________________   Thank you for the opportunity to take part in the care of this patient. If you have any further questions, please contact the neurology consultation attending.  Signed,  Thalia Bloodgood DO  Internal Medicine Resident PGY-3 [place]  Pager: [phone]  Attending Neurologist's  note:  I personally saw this patient, gathering history, performing a full neurologic examination, reviewing relevant labs, personally reviewing relevant imaging including initial head CT, repeat head CT, and formulated the assessment and plan, adding the note above for completeness and clarity to accurately reflect my thoughts  In brief, favor these episodes to be secondary to hypoglycemic events, with potential contribution of symptomatic arrhythmias (given several of these events were prior to pacer implantation).  Greater than 80 minutes spent in care of this patient today, majority at bedside, independent of time spent by resident or time spent supervising resident

## 2023-04-17 DIAGNOSIS — R2990 Unspecified symptoms and signs involving the nervous system: Secondary | ICD-10-CM | POA: Diagnosis not present

## 2023-04-17 LAB — GLUCOSE, CAPILLARY
Glucose-Capillary: 118 mg/dL — ABNORMAL HIGH (ref 70–99)
Glucose-Capillary: 86 mg/dL (ref 70–99)

## 2023-04-17 LAB — BASIC METABOLIC PANEL
Anion gap: 9 (ref 5–15)
BUN: 72 mg/dL — ABNORMAL HIGH (ref 6–20)
CO2: 18 mmol/L — ABNORMAL LOW (ref 22–32)
Calcium: 7.9 mg/dL — ABNORMAL LOW (ref 8.9–10.3)
Chloride: 110 mmol/L (ref 98–111)
Creatinine, Ser: 4.37 mg/dL — ABNORMAL HIGH (ref 0.61–1.24)
GFR, Estimated: 15 mL/min — ABNORMAL LOW (ref >60)
Glucose, Bld: 115 mg/dL — ABNORMAL HIGH (ref 70–99)
Potassium: 4.8 mmol/L (ref 3.5–5.1)
Sodium: 137 mmol/L (ref 135–145)

## 2023-04-17 LAB — CBC WITH DIFFERENTIAL/PLATELET
Abs Immature Granulocytes: 0.02 10*3/uL (ref 0.00–0.07)
Basophils Absolute: 0.1 10*3/uL (ref 0.0–0.1)
Basophils Relative: 1 %
Eosinophils Absolute: 0.4 10*3/uL (ref 0.0–0.5)
Eosinophils Relative: 8 %
HCT: 27.3 % — ABNORMAL LOW (ref 39.0–52.0)
Hemoglobin: 9.2 g/dL — ABNORMAL LOW (ref 13.0–17.0)
Immature Granulocytes: 0 %
Lymphocytes Relative: 19 %
Lymphs Abs: 1 10*3/uL (ref 0.7–4.0)
MCH: 28.8 pg (ref 26.0–34.0)
MCHC: 33.7 g/dL (ref 30.0–36.0)
MCV: 85.3 fL (ref 80.0–100.0)
Monocytes Absolute: 0.5 10*3/uL (ref 0.1–1.0)
Monocytes Relative: 10 %
Neutro Abs: 3.3 10*3/uL (ref 1.7–7.7)
Neutrophils Relative %: 62 %
Platelets: 192 10*3/uL (ref 150–400)
RBC: 3.2 MIL/uL — ABNORMAL LOW (ref 4.22–5.81)
RDW: 18.1 % — ABNORMAL HIGH (ref 11.5–15.5)
WBC: 5.3 10*3/uL (ref 4.0–10.5)
nRBC: 0 % (ref 0.0–0.2)

## 2023-04-17 LAB — UREA NITROGEN, URINE: Urea Nitrogen, Ur: 442 mg/dL

## 2023-04-17 LAB — BRAIN NATRIURETIC PEPTIDE: B Natriuretic Peptide: 661.1 pg/mL — ABNORMAL HIGH (ref 0.0–100.0)

## 2023-04-17 LAB — MAGNESIUM: Magnesium: 1.7 mg/dL (ref 1.7–2.4)

## 2023-04-17 MED ORDER — ATORVASTATIN CALCIUM 80 MG PO TABS: 80.0000 mg | ORAL_TABLET | Freq: Every day | ORAL | 0 refills | Status: AC

## 2023-04-17 MED ORDER — CLOPIDOGREL BISULFATE 75 MG PO TABS: 75.0000 mg | ORAL_TABLET | Freq: Every day | ORAL | 0 refills | Status: DC

## 2023-04-17 MED ORDER — PANTOPRAZOLE SODIUM 40 MG PO TBEC: 40.0000 mg | DELAYED_RELEASE_TABLET | Freq: Every day | ORAL | 0 refills | Status: DC

## 2023-04-17 MED ORDER — CLOPIDOGREL BISULFATE 75 MG PO TABS: 75.0000 mg | ORAL_TABLET | Freq: Every day | ORAL | 0 refills | Status: AC

## 2023-04-17 MED ORDER — ISOSORBIDE MONONITRATE ER 30 MG PO TB24: 30.0000 mg | ORAL_TABLET | Freq: Every day | ORAL | 0 refills | Status: AC

## 2023-04-17 MED ORDER — ATORVASTATIN CALCIUM 80 MG PO TABS: 80.0000 mg | ORAL_TABLET | Freq: Every day | ORAL | 0 refills | Status: DC

## 2023-04-17 MED ORDER — PANTOPRAZOLE SODIUM 40 MG PO TBEC: 40.0000 mg | DELAYED_RELEASE_TABLET | Freq: Every day | ORAL | 0 refills | Status: AC

## 2023-04-17 MED ORDER — ASPIRIN 81 MG PO TBEC: 81.0000 mg | DELAYED_RELEASE_TABLET | Freq: Every day | ORAL | 12 refills | Status: AC

## 2023-04-17 MED ORDER — HYDRALAZINE HCL 25 MG PO TABS: 25.0000 mg | ORAL_TABLET | Freq: Three times a day (TID) | ORAL | 0 refills | Status: DC

## 2023-04-17 MED ORDER — ISOSORBIDE MONONITRATE ER 30 MG PO TB24: 30.0000 mg | ORAL_TABLET | Freq: Every day | ORAL | 0 refills | Status: DC

## 2023-04-17 MED ORDER — ASPIRIN 81 MG PO TBEC: 81.0000 mg | DELAYED_RELEASE_TABLET | Freq: Every day | ORAL | 12 refills | Status: DC

## 2023-04-17 MED ORDER — HYDRALAZINE HCL 25 MG PO TABS: 25.0000 mg | ORAL_TABLET | Freq: Three times a day (TID) | ORAL | 0 refills | Status: AC

## 2023-04-17 MED ORDER — CLOPIDOGREL BISULFATE 75 MG PO TABS
75.0000 mg | ORAL_TABLET | Freq: Every day | ORAL | Status: DC
  Administered 2023-04-17: 75 mg via ORAL
  Filled 2023-04-17: qty 1

## 2023-04-17 NOTE — Progress Notes (Signed)
SLP Cancellation Note  Patient Details Name: Kristopher Hogan MRN: [id number] DOB: 09-18-1966   Cancelled treatment:       Reason Eval/Treat Not Completed: SLP screened, no needs identified, will sign off. Pts previous stroke like symptoms have completely resolved. He reports being back to baseline for cognition and speech and language. No deficits identified in PT or OT report. Neurological imaging negative for acute intracranial abnormalities. Educated on BE-FAST stroke signs.   Ardyth Gal MA, CCC-SLP Acute Rehabilitation Services   04/17/2023, 8:32 AM

## 2023-04-17 NOTE — Progress Notes (Signed)
Head CT and EEG resulted, negative. No change in plan as previously documented, please reach out with any additional neurological questions/concerns.   Brooke Dare MD-PhD [facility] [phone] Available 7 AM to 7 PM, outside these hours please contact Neurologist on call listed on AMION   No charge note

## 2023-04-17 NOTE — Progress Notes (Signed)
Rounding Note    Patient Name: Kristopher Hogan Date of Encounter: 04/17/2023  [facility] Cardiologist: Remote EP at [facility]   Recent at [facility]   Subjective   Denies Dizziness,   No SOB  no palpitations   No CP   Inpatient Medications    Scheduled Meds:  allopurinol  100 mg Oral Daily   aspirin EC  81 mg Oral Daily   atorvastatin  80 mg Oral Daily   carvedilol  25 mg Oral BID WC   enoxaparin (LOVENOX) injection  30 mg Subcutaneous Q24H   hydrALAZINE  25 mg Oral Q8H   insulin aspart  0-9 Units Subcutaneous TID WC   isosorbide mononitrate  30 mg Oral Daily   pantoprazole  40 mg Oral Daily   sodium bicarbonate  650 mg Oral TID   Continuous Infusions:  PRN Meds: acetaminophen **OR** [DISCONTINUED] acetaminophen (TYLENOL) oral liquid 160 mg/5 mL **OR** [DISCONTINUED] acetaminophen, hydrALAZINE, iohexol, senna-docusate   Vital Signs    Vitals:   04/16/23 1738 04/16/23 2020 04/17/23 0041 04/17/23 0523  BP: (!) 142/109 132/70 121/72 110/69  Pulse:  74 73 78  Resp:  18 20 20   Temp:  98.5 F (36.9 C) 98 F (36.7 C) 98.8 F (37.1 C)  TempSrc:  Oral Oral Oral  SpO2:  98% 98% 99%  Weight:      Height:        Intake/Output Summary (Last 24 hours) at 04/17/2023 0706 Last data filed at 04/17/2023 0526 Gross per 24 hour  Intake 720 ml  Output 2145 ml  Net -1425 ml      04/15/2023    5:35 PM 04/15/2023    8:03 AM 07/27/2021    5:54 AM  Last 3 Weights  Weight (lbs) 173 lb 1 oz 172 lb 180 lb  Weight (kg) 78.5 kg 78.019 kg 81.647 kg      Telemetry    SR with PVCs   - Personally Reviewed  ECG    No new  - Personally Reviewed  Physical Exam   GEN: No acute distress.   Neck: No JVD Cardiac: RRR with frequent skips   no signficant murmurs  Respiratory: Clear to auscultation bilaterally. GI: Soft, nontender, non-distended  MS: No edema; No deformity.  Labs    High Sensitivity Troponin:  No results for input(s): "TROPONINIHS" in the last 720 hours.    Chemistry Recent Labs  Lab 04/15/23 0743 04/16/23 0438 04/17/23 0249  NA 136 136 137  K 4.6 4.7 4.8  CL 110 112* 110  CO2 16* 17* 18*  GLUCOSE 234* 129* 115*  BUN 69* 70* 72*  CREATININE 4.10* 4.40* 4.37*  CALCIUM 7.7* 7.7* 7.9*  MG  --   --  1.7  PROT 7.6  --   --   ALBUMIN 3.7 2.8*  --   AST 17  --   --   ALT 17  --   --   ALKPHOS 115  --   --   BILITOT 0.7  --   --   GFRNONAA 16* 15* 15*  ANIONGAP 10 7 9     Lipids  Recent Labs  Lab 04/16/23 0438  CHOL 104  TRIG 67  HDL 39*  LDLCALC 52  CHOLHDL 2.7    Hematology Recent Labs  Lab 04/15/23 0743 04/16/23 0438 04/17/23 0249  WBC 6.6 5.5 5.3  RBC 3.73* 3.32* 3.20*  HGB 10.9* 9.6* 9.2*  HCT 32.3* 28.2* 27.3*  MCV 86.6 84.9 85.3  MCH 29.2 28.9 28.8  MCHC 33.7 34.0 33.7  RDW 18.4* 18.4* 18.1*  PLT 219 188 192   Thyroid  Recent Labs  Lab 04/15/23 1003  TSH 1.585    BNP Recent Labs  Lab 04/16/23 0908 04/17/23 0249  BNP 571.2* 661.1*    DDimer No results for input(s): "DDIMER" in the last 168 hours.   Radiology    CT HEAD WO CONTRAST ( )  Result Date: 04/16/2023 CLINICAL DATA:  Transient ischemic attack EXAM: CT HEAD WITHOUT CONTRAST TECHNIQUE: Contiguous axial images were obtained from the base of the skull through the vertex without intravenous contrast. RADIATION DOSE REDUCTION: This exam was performed according to the departmental dose-optimization program which includes automated exposure control, adjustment of the mA and/or kV according to patient size and/or use of iterative reconstruction technique. COMPARISON:  None Available. FINDINGS: Brain: There is no mass, hemorrhage or extra-axial collection. The size and configuration of the ventricles and extra-axial CSF spaces are normal. The brain parenchyma is normal, without acute or chronic infarction. Vascular: No abnormal hyperdensity of the major intracranial arteries or dural venous sinuses. No intracranial atherosclerosis. Skull: The  visualized skull base, calvarium and extracranial soft tissues are normal. Sinuses/Orbits: No fluid levels or advanced mucosal thickening of the visualized paranasal sinuses. No mastoid or middle ear effusion. The orbits are normal. IMPRESSION: Normal head CT. Electronically Signed   By: Deatra Robinson M.D.   On: 04/16/2023 20:32   US RENAL  Result Date: 04/16/2023 CLINICAL DATA:  [id number] AKI (acute kidney injury) (HCC) [id number] EXAM: RENAL / URINARY TRACT ULTRASOUND COMPLETE COMPARISON:  CT 07/10/2008 FINDINGS: Right Kidney: Renal measurements: 11.0 x 4.5 x 4.5 cm = volume: 118 mL. No hydronephrosis. Increased renal cortical echogenicity. Left Kidney: Renal measurements: 10.1 x 5.0 x 4.6 cm = volume: 116 mL. No hydronephrosis. Increased renal cortical echogenicity. There is a simple left renal cyst which measures up to 2.1 cm in requires no follow-up imaging. Bladder: Appears normal for degree of bladder distention. Other: None. IMPRESSION: No hydronephrosis. Increased renal cortical echogenicity bilaterally, as can be seen in medical renal disease. Electronically Signed   By: Caprice Renshaw M.D.   On: 04/16/2023 18:15   EEG adult  Result Date: 04/16/2023 Charlsie Quest, MD     04/16/2023  4:30 PM Patient Name: Kristopher Hogan MRN: [id number] Epilepsy Attending: Charlsie Quest Referring Physician/Provider: Leroy Sea, MD Date: 04/16/2023 Duration: 22.17 mins Patient history: 57 yo M w/ a hx of HTN, DM, and CKD who presents with left sided weakness. EEG to evaluate for seizure Level of alertness: Awake, asleep AEDs during EEG study: None Technical aspects: This EEG study was done with scalp electrodes positioned according to the 10-20 International system of electrode placement. Electrical activity was reviewed with band pass filter of 1-70Hz , sensitivity of 7 uV/mm, display speed of 67mm/sec with a 60Hz  notched filter applied as appropriate. EEG data were recorded continuously and digitally stored.   Video monitoring was available and reviewed as appropriate. Description: The posterior dominant rhythm consists of 9-10 Hz activity of moderate voltage (25-35 uV) seen predominantly in posterior head regions, symmetric and reactive to eye opening and eye closing. Sleep was characterized by vertex waves, sleep spindles (12 to 14 Hz), maximal frontocentral region. Hyperventilation and photic stimulation were not performed.   IMPRESSION: This study is within normal limits. No seizures or epileptiform discharges were seen throughout the recording. A normal interictal EEG does not exclude the diagnosis of epilepsy. Priyanka O  Yadav   ECHOCARDIOGRAM COMPLETE  Result Date: 04/16/2023    ECHOCARDIOGRAM REPORT   Patient Name:   YOGESH SMOKER Date of Exam: 04/16/2023 Medical Rec #:  [id number]        Height:       71.0 in Accession #:    [id number]       Weight:       173.1 lb Date of Birth:  Dec 29, 1965         BSA:          1.983 m Patient Age:    57 years         BP:           145/74 mmHg Patient Gender: M                HR:           74 bpm. Exam Location:  Inpatient Procedure: 2D Echo, Cardiac Doppler and Color Doppler Indications:    Stroke  History:        Patient has no prior history of Echocardiogram examinations.                 Stroke; Risk Factors:Hypertension, Diabetes and HLD.  Sonographer:    Lucy Antigua Referring Phys: Vassie Loll IMPRESSIONS  1. Frequent PVCs. Left ventricular ejection fraction, by estimation, is 35 to 40%. The left ventricle has moderately decreased function. The left ventricle demonstrates global hypokinesis. There is mild left ventricular hypertrophy. Left ventricular diastolic parameters are indeterminate.  2. Right ventricular systolic function is mildly reduced. The right ventricular size is mildly enlarged. There is mildly elevated pulmonary artery systolic pressure. The estimated right ventricular systolic pressure is 36.2 mmHg.  3. Left atrial size was mildly dilated.  4. The  mitral valve is normal in structure. Trivial mitral valve regurgitation. No evidence of mitral stenosis.  5. The aortic valve is tricuspid. Aortic valve regurgitation is not visualized. No aortic stenosis is present.  6. The inferior vena cava is dilated in size with <50% respiratory variability, suggesting right atrial pressure of 15 mmHg. FINDINGS  Left Ventricle: Left ventricular ejection fraction, by estimation, is 35 to 40%. The left ventricle has moderately decreased function. The left ventricle demonstrates global hypokinesis. The left ventricular internal cavity size was normal in size. There is mild left ventricular hypertrophy. Left ventricular diastolic parameters are indeterminate. Right Ventricle: The right ventricular size is mildly enlarged. No increase in right ventricular wall thickness. Right ventricular systolic function is mildly reduced. There is mildly elevated pulmonary artery systolic pressure. The tricuspid regurgitant  velocity is 2.30 m/s, and with an assumed right atrial pressure of 15 mmHg, the estimated right ventricular systolic pressure is 36.2 mmHg. Left Atrium: Left atrial size was mildly dilated. Right Atrium: Right atrial size was normal in size. Pericardium: There is no evidence of pericardial effusion. Mitral Valve: The mitral valve is normal in structure. Trivial mitral valve regurgitation. No evidence of mitral valve stenosis. Tricuspid Valve: The tricuspid valve is normal in structure. Tricuspid valve regurgitation is trivial. Aortic Valve: The aortic valve is tricuspid. Aortic valve regurgitation is not visualized. No aortic stenosis is present. Aortic valve mean gradient measures 3.0 mmHg. Aortic valve peak gradient measures 6.0 mmHg. Aortic valve area, by VTI measures 3.42 cm. Pulmonic Valve: The pulmonic valve was not well visualized. Pulmonic valve regurgitation is trivial. Aorta: The aortic root and ascending aorta are structurally normal, with no evidence of  dilitation. Venous: The inferior vena cava is  dilated in size with less than 50% respiratory variability, suggesting right atrial pressure of 15 mmHg. IAS/Shunts: The interatrial septum was not well visualized.  LEFT VENTRICLE PLAX 2D LVIDd:         5.20 cm      Diastology LVIDs:         4.30 cm      LV e' medial:    4.35 cm/s LV PW:         1.25 cm      LV E/e' medial:  14.9 LV IVS:        1.15 cm      LV e' lateral:   6.64 cm/s LVOT diam:     2.20 cm      LV E/e' lateral: 9.7 LV SV:         90 LV SV Index:   45 LVOT Area:     3.80 cm  LV Volumes (MOD) LV vol d, MOD A4C: 145.0 ml LV vol s, MOD A4C: 87.1 ml LV SV MOD A4C:     145.0 ml RIGHT VENTRICLE             IVC RV S prime:     13.40 cm/s  IVC diam: 2.50 cm TAPSE (M-mode): 1.6 cm LEFT ATRIUM             Index        RIGHT ATRIUM           Index LA Vol (A2C):   55.2 ml 27.84 ml/m  RA Area:     18.50 cm LA Vol (A4C):   82.3 ml 41.51 ml/m  RA Volume:   60.90 ml  30.71 ml/m LA Biplane Vol: 68.5 ml 34.55 ml/m  AORTIC VALVE AV Area (Vmax):    3.43 cm AV Area (Vmean):   3.48 cm AV Area (VTI):     3.42 cm AV Vmax:           122.00 cm/s AV Vmean:          74.100 cm/s AV VTI:            0.262 m AV Peak Grad:      6.0 mmHg AV Mean Grad:      3.0 mmHg LVOT Vmax:         110.00 cm/s LVOT Vmean:        67.900 cm/s LVOT VTI:          0.236 m LVOT/AV VTI ratio: 0.90  AORTA Ao Root diam: 3.60 cm Ao Asc diam:  3.30 cm MITRAL VALVE               TRICUSPID VALVE MV Area (PHT): 4.89 cm    TR Peak grad:   21.2 mmHg MV Decel Time: 155 msec    TR Vmax:        230.00 cm/s MR Peak grad: 112.8 mmHg MR Vmax:      531.00 cm/s  SHUNTS MV E velocity: 64.70 cm/s  Systemic VTI:  0.24 m MV A velocity: 73.20 cm/s  Systemic Diam: 2.20 cm MV E/A ratio:  0.88 Epifanio Lesches MD Electronically signed by Epifanio Lesches MD Signature Date/Time: 04/16/2023/11:56:45 AM    Final    US Carotid Bilateral (at [facility] and [facility] only)  Result Date: 04/15/2023 CLINICAL DATA:  Stroke symptoms,  hypertension, diabetes EXAM: BILATERAL CAROTID DUPLEX ULTRASOUND TECHNIQUE: Wallace Cullens scale imaging, color Doppler and duplex ultrasound were performed of bilateral carotid and vertebral arteries in the neck. COMPARISON:  None Available. FINDINGS: Criteria: Quantification of carotid stenosis is based on velocity parameters that correlate the residual internal carotid diameter with NASCET-based stenosis levels, using the diameter of the distal internal carotid lumen as the denominator for stenosis measurement. The following velocity measurements were obtained: RIGHT ICA: 61/22 cm/sec CCA: 98/18 cm/sec SYSTOLIC ICA/CCA RATIO:  0.6 ECA: 95 cm/sec LEFT ICA: 73/36 cm/sec CCA: 58/13 cm/sec SYSTOLIC ICA/CCA RATIO:  1.3 ECA: 96 cm/sec RIGHT CAROTID ARTERY: Trace intimal thickening and very thin bifurcation hypoechoic atherosclerosis. Negative for significant stenosis, velocity elevation, turbulent flow. RIGHT VERTEBRAL ARTERY:  Antegrade LEFT CAROTID ARTERY: Similar intimal thickening and trace thin hypoechoic atherosclerosis. Negative for significant stenosis, velocity elevation, or turbulent flow. LEFT VERTEBRAL ARTERY:  Antegrade IMPRESSION: 1. Minor carotid atherosclerosis. Negative for significant stenosis. Degree of narrowing less than 50% bilaterally by ultrasound criteria. 2. Patent antegrade vertebral flow bilaterally. Electronically Signed   By: Judie Petit.  Shick M.D.   On: 04/15/2023 10:12   DG Chest Portable 1 View  Result Date: 04/15/2023 CLINICAL DATA:  Fall EXAM: PORTABLE CHEST 1 VIEW COMPARISON:  Chest radiograph dated 12/16/2007 FINDINGS: Left chest wall pacemaker leads project over the right atrium and ventricle. Normal lung volumes. No focal consolidations. No pleural effusion or pneumothorax. The heart size and mediastinal contours are within normal limits. No radiographic finding of acute displaced fracture. IMPRESSION: 1. No acute cardiopulmonary process. 2.  No radiographic finding of acute displaced fracture.  Electronically Signed   By: Agustin Cree M.D.   On: 04/15/2023 08:31   CT HEAD CODE STROKE WO CONTRAST  Result Date: 04/15/2023 CLINICAL DATA:  Code stroke.  Fall. EXAM: CT HEAD WITHOUT CONTRAST TECHNIQUE: Contiguous axial images were obtained from the base of the skull through the vertex without intravenous contrast. RADIATION DOSE REDUCTION: This exam was performed according to the departmental dose-optimization program which includes automated exposure control, adjustment of the mA and/or kV according to patient size and/or use of iterative reconstruction technique. COMPARISON:  None Available. FINDINGS: Brain: No evidence of acute infarction, hemorrhage, hydrocephalus, extra-axial collection or mass lesion/mass effect. Mild low-density in the cerebral white matter usually from chronic small vessel disease. Vascular: No hyperdense vessel or unexpected calcification. Skull: Normal. Negative for fracture or focal lesion. Sinuses/Orbits: No acute finding. Other: Prelim sent to Dr. Hyacinth Meeker. IMPRESSION: No acute finding. Electronically Signed   By: Tiburcio Pea M.D.   On: 04/15/2023 07:56    Cardiac Studies   Carotid USN   04/15/23  IMPRESSION: 1. Minor carotid atherosclerosis. Negative for significant stenosis. Degree of narrowing less than 50% bilaterally by ultrasound criteria. 2. Patent antegrade vertebral flow bilaterally.  Echo  04/16/23   1. Frequent PVCs. Left ventricular ejection fraction, by estimation, is  35 to 40%. The left ventricle has moderately decreased function. The left  ventricle demonstrates global hypokinesis. There is mild left ventricular  hypertrophy. Left ventricular  diastolic parameters are indeterminate.   2. Right ventricular systolic function is mildly reduced. The right  ventricular size is mildly enlarged. There is mildly elevated pulmonary  artery systolic pressure. The estimated right ventricular systolic  pressure is 36.2 mmHg.   3. Left atrial size was mildly  dilated.   4. The mitral valve is normal in structure. Trivial mitral valve  regurgitation. No evidence of mitral stenosis.   5. The aortic valve is tricuspid. Aortic valve regurgitation is not  visualized. No aortic stenosis is present.   6. The inferior vena cava is dilated in size with <50% respiratory  variability, suggesting right atrial pressure of 15 mmHg.    Outside MRI  03/2023   Indication: 57 years old Male, Reason for Study: new complete  heart block, history of atrial fibrillation status post ablation,  CK D, hypertension, diabetes, presenting with syncope   Comparison: None   TECHNIQUE: Multiplanar, multisequence images of the heart were  obtained. 16 ml of MultiHance intravenous contrast was  administered. No premedication/adverse events.   FINDINGS:   Study is limited due to motion artifact/arrhythmia.   MEASUREMENTS:   Left ventricular end diastolic diameter: About 6.7 cm   Left ventricular end systolic diameter: About 6.4 cm   LV basal anteroseptal wall thickness: About 1.1 cm   LV basal inferolateral wall thickness: About 1 cm   LA diameter - 3 chamber: About 4.3 cm   CHAMBER SIZES:   Left ventricle: Moderately dilated   Right ventricle: Mildly dilated   Left atrium: Mildly dilated   Right atrium: Mildly dilated   LV wall thickness: Borderline mild concentric LVH   FUNCTION:   Left ventricle: Grossly normal (series 10)   - Estimated LVEF: Greater than 50%   - Regional wall motion: Grossly normal   Right ventricle: Grossly normal   VALVES:   Aortic valve: No major stenosis or regurgitation on limited  assessment   Mitral valve: No major stenosis or regurgitation on limited  assessment   Pulmonic valve: Not well visualized   Tricuspid valve: No major stenosis or regurgitation on limited  assessment   LATE GADOLINIUM ENHANCEMENT: No abnormal LGE   THROMBUS: None   PERICARDIUM: Unremarkable   IMAGED THORACIC AORTA: Normal in  size   INCIDENTAL: Dilated main pulmonary artery, measuring up to 4.4 cm   Impression: 1. Study is limited due to motion artifact/arrhythmia.   2. Moderate LV dilatation with borderline mild concentric LVH.  Mild RV and biatrial dilatation.   3. Preserved cardiac function.   4. No abnormal LGE. No evidence of infarct, scarring, or  infiltrative disease.   5. Dilated main pulmonary artery, a finding that can be  associated with pulmonary arterial hypertension.   Electronically Signed By: Vivi Barrack Electronically Signed On: 04/06/2023 10:27 AM   Patient Profile    MOHMMED DHARIA is a 57 y.o. 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 Lars Masson of [facility] on 04/08/2023 who is being seen 04/16/2023 for the evaluation of syncope and CHF at the request of Dr. Thedore Mins.  Assessment & Plan    1  Syncopal evemt   04/15/23   No prodrome   Pacer interrogation without arrhythmia  Not orthostatic yesterday    Neuro has seen   ? Due to hypoglycemia    Less likely TIA     From pacer standpoint OK to start ASA and Plavix     2  HFrEF  Echo with moderate LV dysfunction   No hx of CP   Pt with frequent ectopy on tele  Nothing sustained    THis may be cause of HFrEF Note that MRI report from [place], the EF is estimated  . Continue carvedilol, hydralazine, imdur, jardiance     3  PVCs   Frequent   Will need to be followed as outpt  COntinue b blocker   May need to adjust meds, go up on carvedilol or switch to Toprol XL for better b blocking  effect on heart  Will need monitor as outpt to track burden  4 Hx of SVT   s/p Ablation x 2 1999, early 2000s   5  Hx bradycardia with syncope  s/p PPM ([employer] dual chamber PPM, [facility] [place] on May 2024)  6  HTN  WIll need to be followed   7  HL  On lipitor     OK to d/c from cardiac standpoint.  Will need follow up   here or in [place]  For questions or updates, please contact [facility] Please consult [email or web address] for contact info under        Signed, Dietrich Pates, MD  04/17/2023, 7:06 AM

## 2023-04-17 NOTE — Discharge Summary (Signed)
Kristopher Hogan ZOX:[id number] DOB: [birth year] DOA: 04/15/2023  PCP: [facility]  Admit date: 04/15/2023  Discharge date: 04/17/2023  Admitted From: Home   Disposition:  Home   Recommendations for Outpatient Follow-up:   Follow up with PCP in 1-2 weeks  PCP Please obtain BMP/CBC, 2 view CXR in 1week,  (see Discharge instructions)   PCP Please follow up on the following pending results: Monitor glycemic control, blood pressure , CBGs, BMP closely.  Needs close outpatient follow-up with his neurologist, cardiologist and nephrologist in 7-10 days.   Home Health: None   Equipment/Devices: None  Consultations: Neuro, Cards Discharge Condition: Stable    CODE STATUS: Full    Diet Recommendation: Renal-low carbohydrate, 1.2 L fluid restriction per day.  Chief Complaint  Patient presents with   Fall   Code Stroke     Brief history of present illness from the day of admission and additional interim summary    57 y.o. male with medical history significant of type 2 diabetes with nephropathy, CKDstage IV, hypertension, hyperlipidemia, history of gout and symptomatic bradycardia status post pacemaker implantation about 2 weeks ago; who presented to the hospital after another episode of passing out with urinary incontinence, apparently has been having these episodes for several months, family members thought that he was slurring her speech and had some left-sided weakness although patient denies it, he was subsequently sent to [facility] ER from where he was sent to [facility] for further workup.  Patient states clearly that for the last several months he has been having these episodes of passing out with urinary incontinence, during this workup he also had a pacemaker placement few weeks ago at [facility].  In the  ER thus far workup is negative including head CT.                                                                  Hospital Course   Multiple syncopal episodes with urinary incontinence.     -Patient symptoms completely resolved while in the ED. there was question of left-sided weakness and slurred speech however patient was not aware of this, he claims that he has had multiple recent episodes of syncope with urinary incontinence, head CT x 2 negative, MRI brain cannot be done due to recent placement of pacemaker few weeks ago.  He also had stable EEG, was seen by neurology and cardiology, his recently placed pacemaker was interrogated and it was stable, case discussed with both neurology and cardiology.  Will be discharged on DAPT for 21 days thereafter aspirin only, blood pressure medications adjusted for better control.  No clear reason for syncope, needs close outpatient monitoring and follow-up with his cardiologist and neurologist to be arranged by PCP.  Patient somewhat noncompliant with his medications at  home counseled on compliance.  Possibility that his syncopal events were due to hypoglycemia?  He claims that his sugars have been stable at home.  Type 2 diabetes with nephropathy (HCC) -Patient presented with hypoglycemia.  His A1c was 4.5, due to his worsening renal function question if his home regimen is to be blamed, this continued glipizide.  Requested to check CBGs q. [facility] S and present his CBGs to PCP for monitoring and further adjustment, but relatively stable here without any scheduled medications.  Depressed EF with chronic systolic heart failure, multiple PVCs.  Hypokinesis all noted on echocardiogram.  Seen by cardiology, patient not a candidate for invasive testing or procedures due to his underlying CKD 4.  Currently symptom-free, medication regimen adjusted, closely follow-up with his primary cardiologist in 7 to 10 days.  He has no chest pain or exertional symptoms at this  point.   HLD (hyperlipidemia) -Poorly controlled statin dose adjusted, question compliance as well.  Counseled.   GERD (gastroesophageal reflux disease) -Continue PPI.   Chronic metabolic acidosis -Chronic renal failure -Continue sodium bicarbonate tablet 3 times a day as previously prescribed.   HTN (hypertension) -Poor control again question compliance, blood pressure medications adjusted.   AKI on by CKD (chronic kidney disease) stage 4, GFR 15-29 ml/min (HCC) -last renal ultrasound, last creatinine few years ago is around 3.5, his CKD likely has progressed, stable ultrasound, creatinine remains plateaued with good urine output, will be discharged home with close BMP monitoring in the outpatient setting by PCP and primary cardiologist   Gout -No acute flare currently appreciated -Continue the use of allopurinol.      Discharge diagnosis     Principal Problem:   Stroke-like symptoms Active Problems:   Type 2 diabetes with nephropathy (HCC)   Gout   CKD (chronic kidney disease) stage 4, GFR 15-29 ml/min (HCC)   HTN (hypertension)   Metabolic acidosis   GERD (gastroesophageal reflux disease)   HLD (hyperlipidemia)    Discharge instructions    Discharge Instructions     Discharge instructions   Complete by: As directed    Do not drive, operate heavy machinery, perform activities at heights, swimming or participation in water activities or provide baby sitting services until you have seen by Primary MD or a Neurologist and advised to do so again.  Follow with Primary MD [facility] in 7 days, also follow-up with your cardiologist, nephrologist and a [facility] neurologist within 7 to 10 days of discharge.  Get CBC, CMP, 2 view Chest X ray -  checked next visit with your primary MD    Activity: As tolerated with Full fall precautions use walker/cane & assistance as needed  Disposition Home   Diet: Renal Low Carb, 1.2 lit fluid restriction per  day.  Accuchecks 4 times/day, Once in AM empty stomach and then before each meal. Log in all results and show them to your Prim.MD in 3 days. If any glucose reading is under 80 or above 300 call your Prim MD immidiately. Follow Low glucose instructions for glucose under 80 as instructed.  Special Instructions: If you have smoked or chewed Tobacco  in the last 2 yrs please stop smoking, stop any regular Alcohol  and or any Recreational drug use.  On your next visit with your primary care physician please Get Medicines reviewed and adjusted.  Please request your Prim.MD to go over all Hospital Tests and Procedure/Radiological results at the follow up, please get all Hospital records sent to  your Prim MD by signing hospital release before you go home.  If you experience worsening of your admission symptoms, develop shortness of breath, life threatening emergency, suicidal or homicidal thoughts you must seek medical attention immediately by calling 911 or calling your MD immediately  if symptoms less severe.  You Must read complete instructions/literature along with all the possible adverse reactions/side effects for all the Medicines you take and that have been prescribed to you. Take any new Medicines after you have completely understood and accpet all the possible adverse reactions/side effects.   Increase activity slowly   Complete by: As directed        Discharge Medications   Allergies as of 04/17/2023       Reactions   Penicillins    Has patient had a PCN reaction causing immediate rash, facial/tongue/throat swelling, SOB or lightheadedness with hypotension: No Has patient had a PCN reaction causing severe rash involving mucus membranes or skin necrosis: Yes Has patient had a PCN reaction that required hospitalization No Has patient had a PCN reaction occurring within the last 10 years: No If all of the above answers are "NO", then may proceed with Cephalosporin use.         Medication List     STOP taking these medications    carvedilol 12.5 MG tablet Commonly known as: COREG   glipiZIDE 10 MG tablet Commonly known as: GLUCOTROL   losartan 100 MG tablet Commonly known as: COZAAR   NIFEdipine 60 MG 24 hr tablet Commonly known as: ADALAT CC       TAKE these medications    allopurinol 100 MG tablet Commonly known as: ZYLOPRIM Take 50 mg by mouth every other day.   aspirin EC 81 MG tablet Take 1 tablet (81 mg total) by mouth daily. Swallow whole. Start taking on: Apr 18, 2023   atorvastatin 80 MG tablet Commonly known as: LIPITOR Take 1 tablet (80 mg total) by mouth daily. Start taking on: Apr 18, 2023 What changed:  medication strength how much to take when to take this   Cholecalciferol 50 MCG (2000 UT) Tabs Take 1 tablet by mouth daily.   clopidogrel 75 MG tablet Commonly known as: PLAVIX Take 1 tablet (75 mg total) by mouth daily. Start taking on: Apr 18, 2023   empagliflozin 25 MG Tabs tablet Commonly known as: JARDIANCE Take 0.5 tablets by mouth in the morning.   ferrous sulfate 325 (65 FE) MG tablet Take 325 mg by mouth daily.   hydrALAZINE 25 MG tablet Commonly known as: APRESOLINE Take 1 tablet (25 mg total) by mouth every 8 (eight) hours.   isosorbide mononitrate 30 MG 24 hr tablet Commonly known as: IMDUR Take 1 tablet (30 mg total) by mouth daily. Start taking on: Apr 18, 2023   oxyCODONE-acetaminophen 5-325 MG tablet Commonly known as: PERCOCET/ROXICET Take 1 tablet by mouth every 4 (four) hours as needed.   pantoprazole 40 MG tablet Commonly known as: PROTONIX Take 1 tablet (40 mg total) by mouth daily. Start taking on: Apr 18, 2023   sodium bicarbonate 650 MG tablet Take 650 mg by mouth 3 (three) times daily.         Follow-up Information     [facility]. Schedule an appointment as soon as possible for a visit in 1 week(s).   Specialty: General Practice Why: Also follow-up with  your cardiologist, nephrologist and [facility] neurologist within a week of discharge Contact information: [address] [place] [address]  [phone]                 Major procedures and Radiology Reports - PLEASE review detailed and final reports thoroughly  -     CT HEAD WO CONTRAST ( )  Result Date: 04/16/2023 CLINICAL DATA:  Transient ischemic attack EXAM: CT HEAD WITHOUT CONTRAST TECHNIQUE: Contiguous axial images were obtained from the base of the skull through the vertex without intravenous contrast. RADIATION DOSE REDUCTION: This exam was performed according to the departmental dose-optimization program which includes automated exposure control, adjustment of the mA and/or kV according to patient size and/or use of iterative reconstruction technique. COMPARISON:  None Available. FINDINGS: Brain: There is no mass, hemorrhage or extra-axial collection. The size and configuration of the ventricles and extra-axial CSF spaces are normal. The brain parenchyma is normal, without acute or chronic infarction. Vascular: No abnormal hyperdensity of the major intracranial arteries or dural venous sinuses. No intracranial atherosclerosis. Skull: The visualized skull base, calvarium and extracranial soft tissues are normal. Sinuses/Orbits: No fluid levels or advanced mucosal thickening of the visualized paranasal sinuses. No mastoid or middle ear effusion. The orbits are normal. IMPRESSION: Normal head CT. Electronically Signed   By: Deatra Robinson M.D.   On: 04/16/2023 20:32   US RENAL  Result Date: 04/16/2023 CLINICAL DATA:  [id number] AKI (acute kidney injury) (HCC) [id number] EXAM: RENAL / URINARY TRACT ULTRASOUND COMPLETE COMPARISON:  CT 07/10/2008 FINDINGS: Right Kidney: Renal measurements: 11.0 x 4.5 x 4.5 cm = volume: 118 mL. No hydronephrosis. Increased renal cortical echogenicity. Left Kidney: Renal measurements: 10.1 x 5.0 x 4.6 cm = volume: 116 mL. No hydronephrosis. Increased renal cortical  echogenicity. There is a simple left renal cyst which measures up to 2.1 cm in requires no follow-up imaging. Bladder: Appears normal for degree of bladder distention. Other: None. IMPRESSION: No hydronephrosis. Increased renal cortical echogenicity bilaterally, as can be seen in medical renal disease. Electronically Signed   By: Caprice Renshaw M.D.   On: 04/16/2023 18:15   EEG adult  Result Date: 04/16/2023 Charlsie Quest, MD     04/16/2023  4:30 PM Patient Name: RASHA OLIVIO MRN: [id number] Epilepsy Attending: Charlsie Quest Referring Physician/Provider: Leroy Sea, MD Date: 04/16/2023 Duration: 22.17 mins Patient history: 57 yo M w/ a hx of HTN, DM, and CKD who presents with left sided weakness. EEG to evaluate for seizure Level of alertness: Awake, asleep AEDs during EEG study: None Technical aspects: This EEG study was done with scalp electrodes positioned according to the 10-20 International system of electrode placement. Electrical activity was reviewed with band pass filter of 1-70Hz , sensitivity of 7 uV/mm, display speed of 58mm/sec with a 60Hz  notched filter applied as appropriate. EEG data were recorded continuously and digitally stored.  Video monitoring was available and reviewed as appropriate. Description: The posterior dominant rhythm consists of 9-10 Hz activity of moderate voltage (25-35 uV) seen predominantly in posterior head regions, symmetric and reactive to eye opening and eye closing. Sleep was characterized by vertex waves, sleep spindles (12 to 14 Hz), maximal frontocentral region. Hyperventilation and photic stimulation were not performed.   IMPRESSION: This study is within normal limits. No seizures or epileptiform discharges were seen throughout the recording. A normal interictal EEG does not exclude the diagnosis of epilepsy. Charlsie Quest   ECHOCARDIOGRAM COMPLETE  Result Date: 04/16/2023    ECHOCARDIOGRAM REPORT   Patient Name:   KALYN AMIRIAN Date of Exam:  04/16/2023 Medical Rec #:  [id number]  Height:       71.0 in Accession #:    [id number]       Weight:       173.1 lb Date of Birth:  01-26-1966         BSA:          1.983 m Patient Age:    57 years         BP:           145/74 mmHg Patient Gender: M                HR:           74 bpm. Exam Location:  Inpatient Procedure: 2D Echo, Cardiac Doppler and Color Doppler Indications:    Stroke  History:        Patient has no prior history of Echocardiogram examinations.                 Stroke; Risk Factors:Hypertension, Diabetes and HLD.  Sonographer:    Lucy Antigua Referring Phys: Vassie Loll IMPRESSIONS  1. Frequent PVCs. Left ventricular ejection fraction, by estimation, is 35 to 40%. The left ventricle has moderately decreased function. The left ventricle demonstrates global hypokinesis. There is mild left ventricular hypertrophy. Left ventricular diastolic parameters are indeterminate.  2. Right ventricular systolic function is mildly reduced. The right ventricular size is mildly enlarged. There is mildly elevated pulmonary artery systolic pressure. The estimated right ventricular systolic pressure is 36.2 mmHg.  3. Left atrial size was mildly dilated.  4. The mitral valve is normal in structure. Trivial mitral valve regurgitation. No evidence of mitral stenosis.  5. The aortic valve is tricuspid. Aortic valve regurgitation is not visualized. No aortic stenosis is present.  6. The inferior vena cava is dilated in size with <50% respiratory variability, suggesting right atrial pressure of 15 mmHg. FINDINGS  Left Ventricle: Left ventricular ejection fraction, by estimation, is 35 to 40%. The left ventricle has moderately decreased function. The left ventricle demonstrates global hypokinesis. The left ventricular internal cavity size was normal in size. There is mild left ventricular hypertrophy. Left ventricular diastolic parameters are indeterminate. Right Ventricle: The right ventricular size is mildly  enlarged. No increase in right ventricular wall thickness. Right ventricular systolic function is mildly reduced. There is mildly elevated pulmonary artery systolic pressure. The tricuspid regurgitant  velocity is 2.30 m/s, and with an assumed right atrial pressure of 15 mmHg, the estimated right ventricular systolic pressure is 36.2 mmHg. Left Atrium: Left atrial size was mildly dilated. Right Atrium: Right atrial size was normal in size. Pericardium: There is no evidence of pericardial effusion. Mitral Valve: The mitral valve is normal in structure. Trivial mitral valve regurgitation. No evidence of mitral valve stenosis. Tricuspid Valve: The tricuspid valve is normal in structure. Tricuspid valve regurgitation is trivial. Aortic Valve: The aortic valve is tricuspid. Aortic valve regurgitation is not visualized. No aortic stenosis is present. Aortic valve mean gradient measures 3.0 mmHg. Aortic valve peak gradient measures 6.0 mmHg. Aortic valve area, by VTI measures 3.42 cm. Pulmonic Valve: The pulmonic valve was not well visualized. Pulmonic valve regurgitation is trivial. Aorta: The aortic root and ascending aorta are structurally normal, with no evidence of dilitation. Venous: The inferior vena cava is dilated in size with less than 50% respiratory variability, suggesting right atrial pressure of 15 mmHg. IAS/Shunts: The interatrial septum was not well visualized.  LEFT VENTRICLE PLAX 2D LVIDd:         5.20 cm  Diastology LVIDs:         4.30 cm      LV e' medial:    4.35 cm/s LV PW:         1.25 cm      LV E/e' medial:  14.9 LV IVS:        1.15 cm      LV e' lateral:   6.64 cm/s LVOT diam:     2.20 cm      LV E/e' lateral: 9.7 LV SV:         90 LV SV Index:   45 LVOT Area:     3.80 cm  LV Volumes (MOD) LV vol d, MOD A4C: 145.0 ml LV vol s, MOD A4C: 87.1 ml LV SV MOD A4C:     145.0 ml RIGHT VENTRICLE             IVC RV S prime:     13.40 cm/s  IVC diam: 2.50 cm TAPSE (M-mode): 1.6 cm LEFT ATRIUM              Index        RIGHT ATRIUM           Index LA Vol (A2C):   55.2 ml 27.84 ml/m  RA Area:     18.50 cm LA Vol (A4C):   82.3 ml 41.51 ml/m  RA Volume:   60.90 ml  30.71 ml/m LA Biplane Vol: 68.5 ml 34.55 ml/m  AORTIC VALVE AV Area (Vmax):    3.43 cm AV Area (Vmean):   3.48 cm AV Area (VTI):     3.42 cm AV Vmax:           122.00 cm/s AV Vmean:          74.100 cm/s AV VTI:            0.262 m AV Peak Grad:      6.0 mmHg AV Mean Grad:      3.0 mmHg LVOT Vmax:         110.00 cm/s LVOT Vmean:        67.900 cm/s LVOT VTI:          0.236 m LVOT/AV VTI ratio: 0.90  AORTA Ao Root diam: 3.60 cm Ao Asc diam:  3.30 cm MITRAL VALVE               TRICUSPID VALVE MV Area (PHT): 4.89 cm    TR Peak grad:   21.2 mmHg MV Decel Time: 155 msec    TR Vmax:        230.00 cm/s MR Peak grad: 112.8 mmHg MR Vmax:      531.00 cm/s  SHUNTS MV E velocity: 64.70 cm/s  Systemic VTI:  0.24 m MV A velocity: 73.20 cm/s  Systemic Diam: 2.20 cm MV E/A ratio:  0.88 Epifanio Lesches MD Electronically signed by Epifanio Lesches MD Signature Date/Time: 04/16/2023/11:56:45 AM    Final    US Carotid Bilateral (at [facility] and [facility] only)  Result Date: 04/15/2023 CLINICAL DATA:  Stroke symptoms, hypertension, diabetes EXAM: BILATERAL CAROTID DUPLEX ULTRASOUND TECHNIQUE: Wallace Cullens scale imaging, color Doppler and duplex ultrasound were performed of bilateral carotid and vertebral arteries in the neck. COMPARISON:  None Available. FINDINGS: Criteria: Quantification of carotid stenosis is based on velocity parameters that correlate the residual internal carotid diameter with NASCET-based stenosis levels, using the diameter of the distal internal carotid lumen as the denominator for stenosis measurement. The following velocity measurements were obtained:  RIGHT ICA: 61/22 cm/sec CCA: 98/18 cm/sec SYSTOLIC ICA/CCA RATIO:  0.6 ECA: 95 cm/sec LEFT ICA: 73/36 cm/sec CCA: 58/13 cm/sec SYSTOLIC ICA/CCA RATIO:  1.3 ECA: 96 cm/sec RIGHT CAROTID ARTERY: Trace intimal  thickening and very thin bifurcation hypoechoic atherosclerosis. Negative for significant stenosis, velocity elevation, turbulent flow. RIGHT VERTEBRAL ARTERY:  Antegrade LEFT CAROTID ARTERY: Similar intimal thickening and trace thin hypoechoic atherosclerosis. Negative for significant stenosis, velocity elevation, or turbulent flow. LEFT VERTEBRAL ARTERY:  Antegrade IMPRESSION: 1. Minor carotid atherosclerosis. Negative for significant stenosis. Degree of narrowing less than 50% bilaterally by ultrasound criteria. 2. Patent antegrade vertebral flow bilaterally. Electronically Signed   By: Judie Petit.  Shick M.D.   On: 04/15/2023 10:12   DG Chest Portable 1 View  Result Date: 04/15/2023 CLINICAL DATA:  Fall EXAM: PORTABLE CHEST 1 VIEW COMPARISON:  Chest radiograph dated 12/16/2007 FINDINGS: Left chest wall pacemaker leads project over the right atrium and ventricle. Normal lung volumes. No focal consolidations. No pleural effusion or pneumothorax. The heart size and mediastinal contours are within normal limits. No radiographic finding of acute displaced fracture. IMPRESSION: 1. No acute cardiopulmonary process. 2.  No radiographic finding of acute displaced fracture. Electronically Signed   By: Agustin Cree M.D.   On: 04/15/2023 08:31   CT HEAD CODE STROKE WO CONTRAST  Result Date: 04/15/2023 CLINICAL DATA:  Code stroke.  Fall. EXAM: CT HEAD WITHOUT CONTRAST TECHNIQUE: Contiguous axial images were obtained from the base of the skull through the vertex without intravenous contrast. RADIATION DOSE REDUCTION: This exam was performed according to the departmental dose-optimization program which includes automated exposure control, adjustment of the mA and/or kV according to patient size and/or use of iterative reconstruction technique. COMPARISON:  None Available. FINDINGS: Brain: No evidence of acute infarction, hemorrhage, hydrocephalus, extra-axial collection or mass lesion/mass effect. Mild low-density in the cerebral  white matter usually from chronic small vessel disease. Vascular: No hyperdense vessel or unexpected calcification. Skull: Normal. Negative for fracture or focal lesion. Sinuses/Orbits: No acute finding. Other: Prelim sent to Dr. Hyacinth Meeker. IMPRESSION: No acute finding. Electronically Signed   By: Tiburcio Pea M.D.   On: 04/15/2023 07:56    Micro Results    No results found for this or any previous visit (from the past 240 hour(s)).  Today   Subjective    Kristopher Hogan today has no headache,no chest abdominal pain,no new weakness tingling or numbness, feels much better wants to go home today.    Objective   Blood pressure (!) 146/79, pulse (!) 45, temperature 98.3 F (36.8 C), temperature source Oral, resp. rate (!) 22, height 5\' 11"  (1.803 m), weight 78.5 kg, SpO2 99 %.   Intake/Output Summary (Last 24 hours) at 04/17/2023 0906 Last data filed at 04/17/2023 0526 Gross per 24 hour  Intake 720 ml  Output 2145 ml  Net -1425 ml    Exam  Awake Alert, No new F.N deficits,    [place].AT,PERRAL Supple Neck,   Symmetrical Chest wall movement, Good air movement bilaterally, CTAB RRR,No Gallops,   +ve B.Sounds, Abd Soft, Non tender,  No Cyanosis, Clubbing or edema    Data Review   Recent Labs  Lab 04/15/23 0743 04/16/23 0438 04/17/23 0249  WBC 6.6 5.5 5.3  HGB 10.9* 9.6* 9.2*  HCT 32.3* 28.2* 27.3*  PLT 219 188 192  MCV 86.6 84.9 85.3  MCH 29.2 28.9 28.8  MCHC 33.7 34.0 33.7  RDW 18.4* 18.4* 18.1*  LYMPHSABS 1.0  --  1.0  MONOABS 0.4  --  0.5  EOSABS 0.4  --  0.4  BASOSABS 0.1  --  0.1    Recent Labs  Lab 04/15/23 0743 04/15/23 1003 04/16/23 0438 04/16/23 0908 04/17/23 0249  NA 136  --  136  --  137  K 4.6  --  4.7  --  4.8  CL 110  --  112*  --  110  CO2 16*  --  17*  --  18*  ANIONGAP 10  --  7  --  9  GLUCOSE 234*  --  129*  --  115*  BUN 69*  --  70*  --  72*  CREATININE 4.10*  --  4.40*  --  4.37*  AST 17  --   --   --   --   ALT 17  --   --   --   --    ALKPHOS 115  --   --   --   --   BILITOT 0.7  --   --   --   --   ALBUMIN 3.7  --  2.8*  --   --   INR 1.0  --   --   --   --   TSH  --  1.585  --   --   --   HGBA1C  --  4.5*  --   --   --   BNP  --   --   --  571.2* 661.1*  MG  --   --   --   --  1.7  CALCIUM 7.7*  --  7.7*  --  7.9*    Total Time in preparing paper work, data evaluation and todays exam - 35 minutes  Signature  -    Susa Raring M.D on 04/17/2023 at 9:06 AM   -  To page go to [email or web address]

## 2023-04-17 NOTE — Discharge Instructions (Signed)
Do not drive, operate heavy machinery, perform activities at heights, swimming or participation in water activities or provide baby sitting services until you have seen by Primary MD or a Neurologist and advised to do so again.  Follow with [facility], [facility] in 7 days, also follow-up with your cardiologist, nephrologist and a [facility] within 7 to 10 days of discharge.  Get CBC, CMP, 2 view Chest X ray -  checked next visit with your primary MD    Activity: As tolerated with Full fall precautions use walker/cane & assistance as needed  Disposition Home   Diet: Renal Low Carb, 1.2 lit fluid restriction per day.  Accuchecks 4 times/day, Once in AM empty stomach and then before each meal. Log in all results and show them to your Prim.MD in 3 days. If any glucose reading is under 80 or above 300 call your Prim MD immidiately. Follow Low glucose instructions for glucose under 80 as instructed.  Special Instructions: If you have smoked or chewed Tobacco  in the last 2 yrs please stop smoking, stop any regular Alcohol  and or any Recreational drug use.  On your next visit with your primary care physician please Get Medicines reviewed and adjusted.  Please request your Prim.MD to go over all Hospital Tests and Procedure/Radiological results at the follow up, please get all Hospital records sent to your Prim MD by signing hospital release before you go home.  If you experience worsening of your admission symptoms, develop shortness of breath, life threatening emergency, suicidal or homicidal thoughts you must seek medical attention immediately by calling 911 or calling your MD immediately  if symptoms less severe.  You Must read complete instructions/literature along with all the possible adverse reactions/side effects for all the Medicines you take and that have been prescribed to you. Take any new Medicines after you have completely understood and accpet all the possible  adverse reactions/side effects.
